# Patient Record
Sex: Male | Born: 1960 | Race: Black or African American | Hispanic: No | Marital: Single | Smoking: Former smoker
Health system: Southern US, Community
[De-identification: ages and names within clinical notes are randomized; demographics above are authoritative.]

## PROBLEM LIST (undated history)

## (undated) DIAGNOSIS — H469 Unspecified optic neuritis: Secondary | ICD-10-CM

## (undated) DIAGNOSIS — H5440 Blindness, one eye, unspecified eye: Secondary | ICD-10-CM

## (undated) DIAGNOSIS — E119 Type 2 diabetes mellitus without complications: Secondary | ICD-10-CM

## (undated) DIAGNOSIS — M1990 Unspecified osteoarthritis, unspecified site: Secondary | ICD-10-CM

## (undated) DIAGNOSIS — I10 Essential (primary) hypertension: Secondary | ICD-10-CM

## (undated) HISTORY — PX: ELBOW SURGERY: SHX618

## (undated) HISTORY — PX: CARPAL TUNNEL RELEASE: SHX101

## (undated) HISTORY — PX: APPENDECTOMY: SHX54

## (undated) HISTORY — PX: EYE SURGERY: SHX253

## (undated) HISTORY — PX: COLONOSCOPY: SHX174

## (undated) HISTORY — PX: NECK SURGERY: SHX720

## (undated) HISTORY — PX: OTHER SURGICAL HISTORY: SHX169

## (undated) HISTORY — PX: WRIST SURGERY: SHX841

---

## 1999-02-06 ENCOUNTER — Ambulatory Visit (HOSPITAL_BASED_OUTPATIENT_CLINIC_OR_DEPARTMENT_OTHER): Admission: RE | Admit: 1999-02-06 | Discharge: 1999-02-06 | Payer: Self-pay | Admitting: Orthopaedic Surgery

## 1999-07-23 HISTORY — PX: FOOT SURGERY: SHX648

## 1999-08-23 ENCOUNTER — Ambulatory Visit (HOSPITAL_BASED_OUTPATIENT_CLINIC_OR_DEPARTMENT_OTHER): Admission: RE | Admit: 1999-08-23 | Discharge: 1999-08-23 | Payer: Self-pay | Admitting: Orthopaedic Surgery

## 2000-03-04 ENCOUNTER — Ambulatory Visit (HOSPITAL_BASED_OUTPATIENT_CLINIC_OR_DEPARTMENT_OTHER): Admission: RE | Admit: 2000-03-04 | Discharge: 2000-03-04 | Payer: Self-pay | Admitting: Orthopaedic Surgery

## 2008-03-25 ENCOUNTER — Ambulatory Visit (HOSPITAL_COMMUNITY): Admission: RE | Admit: 2008-03-25 | Discharge: 2008-03-25 | Payer: Self-pay | Admitting: Orthopedic Surgery

## 2008-05-04 ENCOUNTER — Inpatient Hospital Stay (HOSPITAL_COMMUNITY): Admission: RE | Admit: 2008-05-04 | Discharge: 2008-05-05 | Payer: Self-pay | Admitting: Orthopedic Surgery

## 2009-05-01 ENCOUNTER — Encounter: Admission: RE | Admit: 2009-05-01 | Discharge: 2009-05-01 | Payer: Self-pay | Admitting: Orthopedic Surgery

## 2010-12-04 NOTE — Op Note (Signed)
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   POSTOPERATIVE DIAGNOSIS:  Cervical disk herniation at C6-C7 with right  C7 radiculopathy.   OPERATIVE PROCEDURE:  Anterior cervical diskectomy, fusion, and  instrumentation using a size 8 lordotic precut fibular allograft spacer  with a 40-mm contoured anterior Synthes vector plate with 16-XW variable  angle screws placed into the body of C6, 40-mm screws into the body of  C7.   COMPLICATIONS:  None.   CONDITION:  Stable.   INTRAOPERATIVE FINDINGS:  Disk fragment removed from the posterior  lateral right corner and trimmed down the posterior osteophytes,  satisfactory diskectomy, and indirect foraminotomy.   HISTORY:  Brett Wallace is a very pleasant 49 year old gentleman who injured  himself while at work.  He was moving a heavy object and started  noticing significant neck and progressive right arm pain.  His symptoms  progressed and despite physical therapy and appropriate conservative  management, he failed to get improvement.  Because of the persisting arm  pain and neurological signs, he elected to proceed with surgery.  All  appropriate risks, benefits, and alternatives to surgery were discussed  and consent was obtained.   OPERATIVE NOTE:  The patient was brought to the operating room and  placed supine on the operating table.  After successful induction of  general anesthesia and endotracheal intubation, the rolled towels were  placed between the shoulder blades.  The arms were taped.  The anterior  cervical spine was prepped and draped in a standard  fashion.  Because of  his body habitus, it was very difficult to get __________.   I was able to palpate the thyroid cartilage and the cricoid cartilage  and I made a longitudinal incision centered at the cricoid cartilage.  Sharp dissection was carried out down to and through the platysma.  I  then sharply dissected the deep cervical fascia and then bluntly  dissected until I was able to palpate the anterior cervical spine.  I  could also palpate the carotid sheath laterally and I protected it with  a finger.  I then swept the trachea and esophagus medially and retracted  it with appendiceal retractor.  At this point, I had exposure to the  anterior cervical spine.  I then mobilized the prevertebral fascia to  completely expose the anterior longitudinal ligament.  I placed an 18-  gauge spinal needle into the C6-C7 disk space, took an x-ray, and  confirmed that I was at the appropriate level.  Once confirmed, I then  resected the anterior longitudinal ligament from the midbody of C6 to  the midbody of C7.  I then mobilized the longus coli muscles out  laterally to  the level of the uncovertebral joints.   A self-retaining Caspar retractor was placed with 55-mm blade.  I placed  it underneath the longus colli muscles and then the endotracheal cuff  was deflated.  I expanded the retractor and then reinflated the  endotracheal cuff.  I then placed distraction pins into the body of C6-  C7 and distracted the C6-C7 disk space.   At this point, I had excellent exposure.  I then incised the annulus and  using a combination of pituitary rongeurs, curettes, and Kerrison  rongeurs, I resected the entire disk material.  When I was at the  posterolateral margin, there was a free fragment of disk, which was  easily removed with a pituitary rongeur.  At this point, I then used a  fine curette to develop a plane underneath the annulus, and I resected  the entire annulus and remaining disk material  with a combination of 1  and 2 mm curette.  I was able to trim down the posterior osteophytes.  I  then developed a plane underneath the posterior longitudinal ligament  and resected posterior longitudinal ligament to expose the underlying  thecal sac.  At this point, I was able to clearly indicate that there  was no free fragments underneath the posterior longitudinal ligament.  With the diskectomy completed, I then rasped the endplates so I had  bleeding subchondral bone and measured the interbody space and obtained  an 8-mm precut lordotic fibular graft.  I packed this with Actifuse and  malleted it to the appropriate depth.  I then placed a 14-mm Vector  plate, contoured it for the kyphosis of the area and secured it with 16  and 14-mm variable angle self-drilling screws.  I had excellent  fixation.  I removed all remaining retractors and ensured that the  trachea and esophagus were not entrapped underneath the plate.  I then  irrigated it copiously with normal saline, obtained hemostasis using  bipolar electrocautery, and maintained it with FloSeal, returned the  trachea and esophagus to midline, closed the platysma with interrupted 2-  0 Vicryl sutures and the skin was closed with 3-0 Monocryl.  Steri-  Strips, dry dressing, and collar were applied.  The patient was  transferred to the PACU without incident.  At the end of the case, all  needle, instrument, and sponge counts were correct.      Alvy Beal, MD  Electronically Signed     DDB/MEDQ  D:  05/04/2008  T:  05/05/2008  Job:  [id number]

## 2010-12-07 NOTE — Op Note (Signed)
[place]  Patient:    Brett Wallace, Brett Wallace                         MRN: [id number] Proc. Date: 03/04/00 Adm. Date:  [id number] Attending:  Marcene Corning                           Operative Report  PREOPERATIVE DIAGNOSIS:  Right foot painful hardware.  POSTOPERATIVE DIAGNOSIS: Right foot painful hardware.  PROCEDURE:  Removal of painful hardware right foot.  ANESTHESIA:  General.  SURGEON: Lubertha Basque. Jerl Santos, M.D.  ASSISTANT: Prince Rome, PA  INDICATIONS FOR PROCEDURE: The patient is a 50 year old man who is many months out from a mid foot fusion done with two screws.  This has gone on to fuse well and he is experiencing decreased pain.  Unfortunately there is some pain over the prominent hardware on the dorsal aspect of the foot.  He is offered hardware removal at this point.  Informed operative consent was obtained after discussing the possible complications of reaction to anesthesia and infection.  DESCRIPTION OF PROCEDURE:  The patient was taken to the operating suite where the general anesthetic was applied without difficulty.  He was positioned supine and prepped and draped in a normal sterile fashion.  After the insertion of preoperative IV antibiotic two small stab wounds were made with removal of the two prominent 4.5 mm large fragment screws.  I used fluoroscopy throughout this portion of the case to make appropriate intraoperative decisions and read all these views myself.  The wounds were irrigated followed by reapproximation of skin with simple sutures of nylon.  Adaptic was placed over the wound followed by dry gauze and loose Ace wrap.  Estimated blood loss and intraoperative fluids obtained from the anesthesia records.  No tourniquet was used.  DISPOSITION:  The patient was extubated in the operating room and taken to the recovery room in stable condition.  Plans were for him to go home the same day and follow up in the  office in less than a week.  I will contact him by phone tonight. DD:  03/04/00 TD:  03/04/00 Job: [id number]

## 2010-12-07 NOTE — Op Note (Signed)
[place]  Patient:    Brett Wallace                          MRN: [id number] Proc. Date: 08/23/99 Adm. Date:  [id number] Attending:  Marcene Corning                           Operative Report  PREOPERATIVE DIAGNOSIS:  Right midfoot degeneration.  POSTOPERATIVE DIAGNOSIS:  Right midfoot degeneration.  OPERATION PERFORMED: 1. Right midfoot fusion. 2. Right iliac crest bone graft harvest.  ANESTHESIA:  General.  ATTENDING SURGEON:  Lubertha Basque. Jerl Santos, M.D.  ASSISTANT:  Lindwood Qua, P.A.  INDICATIONS FOR PROCEDURE:  The patient is a 50 year old man with a long history of right foot pain.  He is status post a successful hindfoot fusion many years ago and status post an unsuccessful midfoot about eight months ago.  He has persisted with pain in his midfoot and x-rays and CT showed that his attempted midfoot fusion ad not healed.  Planned procedure at this point is for repeat more aggressive attempt at fusion of his midfoot including several other joints.  We were also going to  harvest iliac crest bone graft to maximize her chance of this healing.  The procedure was discussed with the patient and informed operative consent was obtained after discussion of possible complications of reaction to anesthesia, infection, neurovascular injury and obviously, nounion.  DESCRIPTION OF PROCEDURE:  The patient was taken to an operating suite where general anesthetic was applied with LMA without difficulty.  He was then positioned supine with a bump under the right hip and prepped and draped in normal sterile  fashion.  After administration of preop IV antibiotics, a small incision was made over the iliac crest from the ASIF posterior.  Dissection was carried down to the crest.  The fascia was incised to expose the bone on the superior aspect of the  iliac crest.  An osteotome was used to create a window of bone which was removed. A curet was  used to remove cancellous bone graft.  Gelfoam was then placed after thorough irrigation of the space.  The cortical window was replaced followed by  repair of the fascia with #1 Vicryl in interrupted fashion.  Adipose tissue was  repaired in a couple of layers with absorbable suture followed by skin closure ith staples.  Adaptic was placed on this wound followed by dry gauze and tape.  The  right leg was then elevated, exsanguinated and a tourniquet inflated about the calf.  A dorsal incision was made from the lateral aspect of the navicular distal into the first webspace area.  Care was taken to keep this 4 cm from an old medial incision.  Dissection was carried down to the EHL tendon.  Care was taken to preserve several branches of the superficial peroneal nerve which were seen and the neurovascular bundle.  Subperiosteal dissection was carried out in either direction starting directly below the EHL tendon which was retracted out of harms way. Under fluoroscopic guidance, the unfused joints were localized.  These were primarily between the cuneiforms and the more proximal bones and also between the cuneiforms themselves.  A lamina spreader was used to create some space and to distract unfused joints.  The rongeur was used to remove the cartilage and scar  tissue.  A rectangular window was made on the top of the fusion area  across several joints.  The areas were then packed with the autologous bone graft.  The construct was then stabilized with two 4.5 mm crossed partially threaded cancellous screws. These were placed under fluoroscopic guidance.  Once all this was completed, the unfused joints disappeared on the fluoroscopy views.  The tourniquet was deflated and the foot became pink and warm immediately.  The wound was irrigated followed by closure of subcutaneous tissues with 2-0 undyed Vicryl and skin with staples. Adaptic was placed on the wound followed by dry gauze  and a posterior splint of  plaster.  Estimated blood loss and intraoperative fluids can be obtained from Anesthesia records as can accurate tourniquet time which was approximately one hour.  DISPOSITION:  The patient was taken to the recovery room in stable condition. Plans were for him to stay overnight with discharge home in the morning primarily for pain control. DD:  08/23/99 TD:  08/23/99 Job: [id number]

## 2011-04-23 LAB — CBC
Hemoglobin: 13.5
MCHC: 32.6
RDW: 13.4

## 2011-07-23 ENCOUNTER — Encounter: Payer: Self-pay | Admitting: Emergency Medicine

## 2011-07-23 ENCOUNTER — Emergency Department (INDEPENDENT_AMBULATORY_CARE_PROVIDER_SITE_OTHER): Payer: Self-pay

## 2011-07-23 ENCOUNTER — Emergency Department (HOSPITAL_BASED_OUTPATIENT_CLINIC_OR_DEPARTMENT_OTHER)
Admission: EM | Admit: 2011-07-23 | Discharge: 2011-07-23 | Disposition: A | Discharge status: Home / Self Care | Payer: Self-pay | Attending: Emergency Medicine | Admitting: Emergency Medicine

## 2011-07-23 DIAGNOSIS — F17200 Nicotine dependence, unspecified, uncomplicated: Secondary | ICD-10-CM | POA: Insufficient documentation

## 2011-07-23 DIAGNOSIS — R142 Eructation: Secondary | ICD-10-CM

## 2011-07-23 DIAGNOSIS — K5730 Diverticulosis of large intestine without perforation or abscess without bleeding: Secondary | ICD-10-CM | POA: Insufficient documentation

## 2011-07-23 DIAGNOSIS — K5732 Diverticulitis of large intestine without perforation or abscess without bleeding: Secondary | ICD-10-CM | POA: Insufficient documentation

## 2011-07-23 DIAGNOSIS — E7889 Other lipoprotein metabolism disorders: Secondary | ICD-10-CM

## 2011-07-23 DIAGNOSIS — I10 Essential (primary) hypertension: Secondary | ICD-10-CM | POA: Insufficient documentation

## 2011-07-23 DIAGNOSIS — R109 Unspecified abdominal pain: Secondary | ICD-10-CM | POA: Insufficient documentation

## 2011-07-23 DIAGNOSIS — K5792 Diverticulitis of intestine, part unspecified, without perforation or abscess without bleeding: Secondary | ICD-10-CM

## 2011-07-23 HISTORY — DX: Unspecified optic neuritis: H46.9

## 2011-07-23 HISTORY — DX: Blindness, one eye, unspecified eye: H54.40

## 2011-07-23 HISTORY — DX: Essential (primary) hypertension: I10

## 2011-07-23 LAB — CBC
MCH: 29 pg (ref 26.0–34.0)
MCHC: 33.3 g/dL (ref 30.0–36.0)
Platelets: 214 10*3/uL (ref 150–400)
RBC: 4.65 MIL/uL (ref 4.22–5.81)
RDW: 12.4 % (ref 11.5–15.5)
WBC: 6.4 10*3/uL (ref 4.0–10.5)

## 2011-07-23 LAB — COMPREHENSIVE METABOLIC PANEL
ALT: 11 U/L (ref 0–53)
Alkaline Phosphatase: 53 U/L (ref 39–117)
BUN: 8 mg/dL (ref 6–23)
CO2: 24 mEq/L (ref 19–32)
Glucose, Bld: 138 mg/dL — ABNORMAL HIGH (ref 70–99)
Sodium: 139 mEq/L (ref 135–145)
Total Bilirubin: 0.4 mg/dL (ref 0.3–1.2)
Total Protein: 7.7 g/dL (ref 6.0–8.3)

## 2011-07-23 LAB — URINE MICROSCOPIC-ADD ON

## 2011-07-23 LAB — LIPASE, BLOOD: Lipase: 24 U/L (ref 11–59)

## 2011-07-23 LAB — DIFFERENTIAL
Lymphs Abs: 1.6 10*3/uL (ref 0.7–4.0)
Neutro Abs: 4 10*3/uL (ref 1.7–7.7)

## 2011-07-23 LAB — URINALYSIS, ROUTINE W REFLEX MICROSCOPIC: Ketones, ur: NEGATIVE mg/dL

## 2011-07-23 MED ORDER — CIPROFLOXACIN HCL 500 MG PO TABS: 500.0000 mg | ORAL_TABLET | Freq: Two times a day (BID) | ORAL | Status: AC

## 2011-07-23 MED ORDER — HYDROCODONE-ACETAMINOPHEN 5-325 MG PO TABS: 1.0000 | ORAL_TABLET | Freq: Four times a day (QID) | ORAL | Status: AC | PRN

## 2011-07-23 MED ORDER — METRONIDAZOLE IN NACL 5-0.79 MG/ML-% IV SOLN: 500.0000 mg | Freq: Once | INTRAVENOUS | Status: DC

## 2011-07-23 MED ORDER — METRONIDAZOLE 500 MG PO TABS: 500.0000 mg | ORAL_TABLET | Freq: Three times a day (TID) | ORAL | Status: AC

## 2011-07-23 MED ORDER — SODIUM CHLORIDE 0.9 % IV SOLN
INTRAVENOUS | Status: DC
  Administered 2011-07-23: 05:00:00 via INTRAVENOUS

## 2011-07-23 MED ORDER — CIPROFLOXACIN HCL 500 MG PO TABS
500.0000 mg | ORAL_TABLET | Freq: Once | ORAL | Status: AC
  Administered 2011-07-23: 500 mg via ORAL
  Filled 2011-07-23: qty 1

## 2011-07-23 MED ORDER — IOHEXOL 300 MG/ML  SOLN
100.0000 mL | Freq: Once | INTRAMUSCULAR | Status: AC | PRN
  Administered 2011-07-23: 100 mL via INTRAVENOUS

## 2011-07-23 MED ORDER — IOHEXOL 300 MG/ML  SOLN: 20.0000 mL | INTRAMUSCULAR | Status: AC

## 2011-07-23 MED ORDER — METRONIDAZOLE 500 MG PO TABS
500.0000 mg | ORAL_TABLET | Freq: Once | ORAL | Status: AC
  Administered 2011-07-23: 500 mg via ORAL
  Filled 2011-07-23: qty 1

## 2011-07-23 NOTE — ED Provider Notes (Signed)
History     CSN: [id number]  Arrival date & time 07/23/11  9562   First MD Initiated Contact with Patient 07/23/11 [id number]      Chief Complaint  Patient presents with  . Abdominal Pain    abd pain with bloating Hx of inguinal Hernia    (Consider location/radiation/quality/duration/timing/severity/associated sxs/prior treatment) HPI Is a 51 year old black male with a four-day history of abdominal pain. The pain seems to originate in the epigastrium but moves around to the abdomen diffusely. He also feels that his abdomen is distended and tense. The pain is moderate to severe. It is worse with palpation or movement. He has had no nausea, vomiting, diarrhea or constipation. He has had no fever or chills. He has had no chest pain or shortness of breath. The pain has not changed with eating nor has his appetite changed. He states the pain became worse this morning which is why he is here. He has a history of bilateral inguinal hernias but states that they have not acutely changed.  Past Medical History  Diagnosis Date  . Hypertension   . Optic neuritis, left   . Blind left eye     Past Surgical History  Procedure Date  . Appendectomy   . Eye surgery   . Neck sugery     History reviewed. No pertinent family history.  History  Substance Use Topics  . Smoking status: Current Everyday Smoker  . Smokeless tobacco: Not on file  . Alcohol Use: 2.4 oz/week    4 Cans of beer per week      Review of Systems  All other systems reviewed and are negative.    Allergies  Penicillins  Home Medications   Current Outpatient Rx  Name Route Sig Dispense Refill  . CYCLOBENZAPRINE HCL 10 MG PO TABS Oral Take 10 mg by mouth 3 (three) times daily as needed.      Marland Kitchen LISINOPRIL 10 MG PO TABS Oral Take 10 mg by mouth daily.        BP 155/88  Pulse 100  Temp 98.5 F (36.9 C)  Resp 20  SpO2 97%  Physical Exam General: Well-developed, well-nourished male in no acute distress; appearance  consistent with age of record HENT: normocephalic, atraumatic Eyes: Left pupil round and fixed; right pupil round reactive; mild right lateral strabismus, extraocular muscles otherwise intact; arcus senilis bilaterally Neck: supple Heart: regular rate and rhythm Lungs: clear to auscultation bilaterally Abdomen:  Soft; distended; diffusely tender; bowel sounds present; bilateral nontender inguinal hernias  Extremities: No deformity; full range of motion Neurologic: Awake, alert and oriented; motor function intact in all extremities and symmetric; no facial droop Skin: Warm and dry Psychiatric: Normal mood and affect    ED Course  Procedures (including critical care time)    MDM   Nursing notes and vitals signs, including pulse oximetry, reviewed.  Summary of this visit's results, reviewed by myself:  Labs:  Results for orders placed during the hospital encounter of 07/23/11  CBC      Component Value Range   WBC 6.4  4.0 - 10.5 (K/uL)   RBC 4.65  4.22 - 5.81 (MIL/uL)   Hemoglobin 13.5  13.0 - 17.0 (g/dL)   HCT 65.7  84.6 - 96.2 (%)   MCV 87.3  78.0 - 100.0 (fL)   MCH 29.0  26.0 - 34.0 (pg)   MCHC 33.3  30.0 - 36.0 (g/dL)   RDW 95.2  84.1 - 32.4 (%)   Platelets 214  150 - 400 (K/uL)  DIFFERENTIAL      Component Value Range   Neutrophils Relative 63  43 - 77 (%)   Neutro Abs 4.0  1.7 - 7.7 (K/uL)   Lymphocytes Relative 26  12 - 46 (%)   Lymphs Abs 1.6  0.7 - 4.0 (K/uL)   Monocytes Relative 10  3 - 12 (%)   Monocytes Absolute 0.6  0.1 - 1.0 (K/uL)   Eosinophils Relative 2  0 - 5 (%)   Eosinophils Absolute 0.1  0.0 - 0.7 (K/uL)   Basophils Relative 1  0 - 1 (%)   Basophils Absolute 0.0  0.0 - 0.1 (K/uL)  COMPREHENSIVE METABOLIC PANEL      Component Value Range   Sodium 139  135 - 145 (mEq/L)   Potassium 3.6  3.5 - 5.1 (mEq/L)   Chloride 102  96 - 112 (mEq/L)   CO2 24  19 - 32 (mEq/L)   Glucose, Bld 138 (*) 70 - 99 (mg/dL)   BUN 8  6 - 23 (mg/dL)   Creatinine, Ser  6.04  0.50 - 1.35 (mg/dL)   Calcium 9.1  8.4 - 54.0 (mg/dL)   Total Protein 7.7  6.0 - 8.3 (g/dL)   Albumin 3.9  3.5 - 5.2 (g/dL)   AST 23  0 - 37 (U/L)   ALT 11  0 - 53 (U/L)   Alkaline Phosphatase 53  39 - 117 (U/L)   Total Bilirubin 0.4  0.3 - 1.2 (mg/dL)   GFR calc non Af Amer 78 (*) >90 (mL/min)   GFR calc Af Amer >90  >90 (mL/min)  LIPASE, BLOOD      Component Value Range   Lipase 24  11 - 59 (U/L)  URINALYSIS, ROUTINE W REFLEX MICROSCOPIC      Component Value Range   Color, Urine YELLOW  YELLOW    APPearance CLEAR  CLEAR    Specific Gravity, Urine 1.016  1.005 - 1.030    pH 5.5  5.0 - 8.0    Glucose, UA NEGATIVE  NEGATIVE (mg/dL)   Hgb urine dipstick TRACE (*) NEGATIVE    Bilirubin Urine NEGATIVE  NEGATIVE    Ketones, ur NEGATIVE  NEGATIVE (mg/dL)   Protein, ur NEGATIVE  NEGATIVE (mg/dL)   Urobilinogen, UA 0.2  0.0 - 1.0 (mg/dL)   Nitrite NEGATIVE  NEGATIVE    Leukocytes, UA TRACE (*) NEGATIVE   URINE MICROSCOPIC-ADD ON      Component Value Range   Squamous Epithelial / LPF FEW (*) RARE    WBC, UA 3-6  <3 (WBC/hpf)   RBC / HPF 0-2  <3 (RBC/hpf)   Bacteria, UA MANY (*) RARE    Ct Abdomen Pelvis W Contrast  07/23/2011  *RADIOLOGY REPORT*  Clinical Data: Abdominal pain and distension for 5-10 days.  The  CT ABDOMEN AND PELVIS WITH CONTRAST  Technique:  Multidetector CT imaging of the abdomen and pelvis was performed following the standard protocol during bolus administration of intravenous contrast.  Contrast: OMNIPAQUE IOHEXOL 300 MG/ML IV SOLN  Comparison: None.  Findings: Slight interstitial fibrosis or dependent atelectasis in the lung bases.  Calcified granulomas in the liver.  The liver, spleen, gallbladder, and pancreas are otherwise unremarkable. Adrenal glands, kidneys, abdominal aorta, and mesenteric lymph nodes are unremarkable.  The stomach and small bowel are not distended.  Stool filled colon without distension or wall thickening.  Minimal periumbilical  hernia containing fat.  No free fluid or free air in the  abdomen.  Mildly prominent visceral adipose tissues.  Retroperitoneal lymph nodes are present in the periaortic and pericaval regions, measuring up to about 10 mm diameter, representing upper limits of normal size.  These may represent reactive lymph nodes.  Pelvis:  Diverticulosis of the sigmoid colon with inflammatory stranding in the fat around the mid sigmoid region consistent with diverticulitis.  No focal abscess.  Small fat containing bilateral inguinal hernias.  Bladder wall is not thickened.  No free fluid or loculated fluid collection in the abdomen.  The appendix is surgically absent.  Mild degenerative changes in the spine with normal alignment.  IMPRESSION: Diverticulosis of the sigmoid colon with.  Sigmoid fatty infiltration consistent with diverticulitis.  No focal abscess.  No free air or free fluid.  Probable reactive retroperitoneal lymph nodes.  Original Report Authenticated By: Marlon Pel, M.D.            Hanley Seamen, MD 07/23/11 [phone]

## 2011-07-23 NOTE — ED Notes (Signed)
Pt reports abd distention x 3-5 days

## 2013-11-02 ENCOUNTER — Encounter (HOSPITAL_BASED_OUTPATIENT_CLINIC_OR_DEPARTMENT_OTHER): Payer: Self-pay | Admitting: Emergency Medicine

## 2013-11-02 ENCOUNTER — Emergency Department (HOSPITAL_BASED_OUTPATIENT_CLINIC_OR_DEPARTMENT_OTHER)
Admission: EM | Admit: 2013-11-02 | Discharge: 2013-11-02 | Discharge status: Against Medical Advice | Payer: Self-pay | Attending: Emergency Medicine | Admitting: Emergency Medicine

## 2013-11-02 DIAGNOSIS — E119 Type 2 diabetes mellitus without complications: Secondary | ICD-10-CM | POA: Insufficient documentation

## 2013-11-02 DIAGNOSIS — M549 Dorsalgia, unspecified: Secondary | ICD-10-CM | POA: Insufficient documentation

## 2013-11-02 DIAGNOSIS — M542 Cervicalgia: Secondary | ICD-10-CM | POA: Insufficient documentation

## 2013-11-02 DIAGNOSIS — F17200 Nicotine dependence, unspecified, uncomplicated: Secondary | ICD-10-CM | POA: Insufficient documentation

## 2013-11-02 DIAGNOSIS — I10 Essential (primary) hypertension: Secondary | ICD-10-CM | POA: Insufficient documentation

## 2013-11-02 DIAGNOSIS — M79609 Pain in unspecified limb: Secondary | ICD-10-CM | POA: Insufficient documentation

## 2013-11-02 HISTORY — DX: Type 2 diabetes mellitus without complications: E11.9

## 2013-11-02 NOTE — ED Notes (Signed)
Pt not in room, gown lying on bed,.

## 2013-11-02 NOTE — ED Notes (Signed)
Hx djd  Onset x 1 week has plate in neck  Now has pain in neck back and both arms

## 2014-02-28 ENCOUNTER — Encounter (HOSPITAL_BASED_OUTPATIENT_CLINIC_OR_DEPARTMENT_OTHER): Payer: Self-pay | Admitting: Emergency Medicine

## 2014-02-28 ENCOUNTER — Emergency Department (HOSPITAL_BASED_OUTPATIENT_CLINIC_OR_DEPARTMENT_OTHER)
Admission: EM | Admit: 2014-02-28 | Discharge: 2014-02-28 | Disposition: A | Discharge status: Home / Self Care | Payer: Self-pay | Attending: Emergency Medicine | Admitting: Emergency Medicine

## 2014-02-28 DIAGNOSIS — M545 Low back pain, unspecified: Secondary | ICD-10-CM | POA: Insufficient documentation

## 2014-02-28 DIAGNOSIS — E119 Type 2 diabetes mellitus without complications: Secondary | ICD-10-CM | POA: Insufficient documentation

## 2014-02-28 DIAGNOSIS — I10 Essential (primary) hypertension: Secondary | ICD-10-CM | POA: Insufficient documentation

## 2014-02-28 DIAGNOSIS — M542 Cervicalgia: Secondary | ICD-10-CM | POA: Insufficient documentation

## 2014-02-28 DIAGNOSIS — H5440 Blindness, one eye, unspecified eye: Secondary | ICD-10-CM | POA: Insufficient documentation

## 2014-02-28 DIAGNOSIS — F17200 Nicotine dependence, unspecified, uncomplicated: Secondary | ICD-10-CM | POA: Insufficient documentation

## 2014-02-28 DIAGNOSIS — K648 Other hemorrhoids: Secondary | ICD-10-CM | POA: Insufficient documentation

## 2014-02-28 DIAGNOSIS — Z880 Allergy status to penicillin: Secondary | ICD-10-CM | POA: Insufficient documentation

## 2014-02-28 DIAGNOSIS — K649 Unspecified hemorrhoids: Secondary | ICD-10-CM

## 2014-02-28 DIAGNOSIS — K625 Hemorrhage of anus and rectum: Secondary | ICD-10-CM | POA: Insufficient documentation

## 2014-02-28 DIAGNOSIS — Z79899 Other long term (current) drug therapy: Secondary | ICD-10-CM | POA: Insufficient documentation

## 2014-02-28 LAB — CBC WITH DIFFERENTIAL/PLATELET
Basophils Absolute: 0 10*3/uL (ref 0.0–0.1)
Basophils Relative: 1 % (ref 0–1)
Eosinophils Absolute: 0.2 10*3/uL (ref 0.0–0.7)
Eosinophils Relative: 3 % (ref 0–5)
HCT: 38.8 % — ABNORMAL LOW (ref 39.0–52.0)
Hemoglobin: 13.3 g/dL (ref 13.0–17.0)
LYMPHS PCT: 29 % (ref 12–46)
Lymphs Abs: 1.7 10*3/uL (ref 0.7–4.0)
MCH: 31.6 pg (ref 26.0–34.0)
MCHC: 34.3 g/dL (ref 30.0–36.0)
MCV: 92.2 fL (ref 78.0–100.0)
Monocytes Absolute: 0.5 10*3/uL (ref 0.1–1.0)
Monocytes Relative: 9 % (ref 3–12)
Neutro Abs: 3.5 10*3/uL (ref 1.7–7.7)
Neutrophils Relative %: 58 % (ref 43–77)
PLATELETS: 203 10*3/uL (ref 150–400)
RBC: 4.21 MIL/uL — AB (ref 4.22–5.81)
RDW: 11.3 % — AB (ref 11.5–15.5)
WBC: 5.9 10*3/uL (ref 4.0–10.5)

## 2014-02-28 LAB — COMPREHENSIVE METABOLIC PANEL
ALBUMIN: 3.9 g/dL (ref 3.5–5.2)
ALT: 30 U/L (ref 0–53)
ANION GAP: 16 — AB (ref 5–15)
AST: 30 U/L (ref 0–37)
Alkaline Phosphatase: 53 U/L (ref 39–117)
BUN: 18 mg/dL (ref 6–23)
CO2: 23 mEq/L (ref 19–32)
CREATININE: 1.7 mg/dL — AB (ref 0.50–1.35)
Calcium: 9.6 mg/dL (ref 8.4–10.5)
Chloride: 96 mEq/L (ref 96–112)
GFR calc non Af Amer: 45 mL/min — ABNORMAL LOW (ref >90)
GFR, EST AFRICAN AMERICAN: 52 mL/min — AB (ref >90)
Glucose, Bld: 164 mg/dL — ABNORMAL HIGH (ref 70–99)
Potassium: 4.1 mEq/L (ref 3.7–5.3)
Sodium: 135 mEq/L — ABNORMAL LOW (ref 137–147)
TOTAL PROTEIN: 7.6 g/dL (ref 6.0–8.3)
Total Bilirubin: 0.3 mg/dL (ref 0.3–1.2)

## 2014-02-28 LAB — OCCULT BLOOD X 1 CARD TO LAB, STOOL: Fecal Occult Bld: POSITIVE — AB

## 2014-02-28 MED ORDER — IBUPROFEN 200 MG PO TABS: 600.0000 mg | ORAL_TABLET | Freq: Three times a day (TID) | ORAL | Status: DC

## 2014-02-28 MED ORDER — DOCUSATE SODIUM 100 MG PO CAPS: 100.0000 mg | ORAL_CAPSULE | Freq: Two times a day (BID) | ORAL | Status: DC

## 2014-02-28 NOTE — ED Provider Notes (Signed)
CSN: [id number]     Arrival date & time 02/28/14  1501 History  This chart was scribed for Candyce Churn III, * by Modena Jansky, ED Scribe. This patient was seen in room MH02/MH02 and the patient's care was started at 5:15 PM.    Chief Complaint  Patient presents with  . Rectal Bleeding   Patient is a 53 y.o. male presenting with hematochezia. The history is provided by the patient. No language interpreter was used.  Rectal Bleeding Quality:  Bright red Duration:  1 day Timing:  Constant Progression:  Unchanged Chronicity:  New Context: spontaneously   Context: not rectal pain   Similar prior episodes: no   Relieved by:  None tried Worsened by:  Nothing tried Ineffective treatments:  None tried Associated symptoms: abdominal pain   Associated symptoms: no fever    HPI Comments: Brett Wallace is a  53 y.o. male with a hx diverticulitis who presents to the Emergency Department complaining of rectal bleeding that has been going on for a day. He states that he notices bright red blood on toilet paper after his BMs. He states that he has a cramping abdominal pain. He reports no rectal pain or constipation. He states that his diet is normal except that he does not eat enough fiber at times. He denies any fever.   Pt also complains of constant moderate pain in his back, neck, and lower extremities. He states that he is on medication for his pain.   Past Medical History  Diagnosis Date  . Hypertension   . Optic neuritis, left   . Blind left eye   . Diabetes mellitus without complication    Past Surgical History  Procedure Laterality Date  . Appendectomy    . Eye surgery    . Neck sugery     History reviewed. No pertinent family history. History  Substance Use Topics  . Smoking status: Current Every Day Smoker  . Smokeless tobacco: Not on file  . Alcohol Use: 2.4 oz/week    4 Cans of beer per week    Review of Systems  Constitutional: Negative for fever.   Gastrointestinal: Positive for abdominal pain, hematochezia and anal bleeding. Negative for constipation.  Musculoskeletal: Positive for back pain, myalgias and neck pain.  All other systems reviewed and are negative.  Allergies  Penicillins  Home Medications   Prior to Admission medications   Medication Sig Start Date End Date Taking? Authorizing Provider  allopurinol (ZYLOPRIM) 100 MG tablet Take 100 mg by mouth daily.   Yes Historical Provider, MD  hydrochlorothiazide (HYDRODIURIL) 25 MG tablet Take 25 mg by mouth daily.   Yes Historical Provider, MD  pravastatin (PRAVACHOL) 10 MG tablet Take 10 mg by mouth daily.   Yes Historical Provider, MD  carvedilol (COREG) 25 MG tablet Take 25 mg by mouth 2 (two) times daily with a meal.    Historical Provider, MD  lisinopril (PRINIVIL,ZESTRIL) 10 MG tablet Take 10 mg by mouth daily.      Historical Provider, MD  metFORMIN (GLUCOPHAGE) 1000 MG tablet Take 1,000 mg by mouth 2 (two) times daily with a meal.    Historical Provider, MD   BP 130/80  Pulse 76  Temp(Src) 98.3 F (36.8 C) (Oral)  Resp 18  Ht 5\' 10"  (1.778 m)  Wt 277 lb (125.646 kg)  BMI 39.75 kg/m2  SpO2 99% Physical Exam  Nursing note and vitals reviewed. Constitutional: He is oriented to person, place, and time. He appears well-developed  and well-nourished. No distress.  HENT:  Head: Normocephalic and atraumatic.  Mouth/Throat: Oropharynx is clear and moist.  Eyes: Conjunctivae are normal. Pupils are equal, round, and reactive to light. No scleral icterus.  Neck: Neck supple.  Cardiovascular: Normal rate, regular rhythm, normal heart sounds and intact distal pulses.   No murmur heard. Pulmonary/Chest: Effort normal and breath sounds normal. No stridor. No respiratory distress. He has no wheezes. He has no rales.  Abdominal: Soft. He exhibits no distension. There is no tenderness. There is no rigidity, no rebound and no guarding.  Genitourinary: Rectal exam shows internal  hemorrhoid. Rectal exam shows no external hemorrhoid and no tenderness. Guaiac positive stool (minimal stool in rectum, light brown in color.).  Musculoskeletal: Normal range of motion. He exhibits no edema.       Lumbar back: He exhibits tenderness (left sided) and bony tenderness.  Neurological: He is alert and oriented to person, place, and time. He has normal strength.  Reflex Scores:      Patellar reflexes are 2+ on the right side and 2+ on the left side. Normal BLE strength  Skin: Skin is warm and dry. No rash noted.  Psychiatric: He has a normal mood and affect. His behavior is normal.    ED Course  Procedures (including critical care time) DIAGNOSTIC STUDIES: Oxygen Saturation is 99% on RA, normal by my interpretation.    COORDINATION OF CARE: 5:19 PM- Pt advised of plan for treatment which includes labs and pt agrees.  Labs Review Labs Reviewed  CBC WITH DIFFERENTIAL - Abnormal; Notable for the following:    RBC 4.21 (*)    HCT 38.8 (*)    RDW 11.3 (*)    All other components within normal limits  COMPREHENSIVE METABOLIC PANEL - Abnormal; Notable for the following:    Sodium 135 (*)    Glucose, Bld 164 (*)    Creatinine, Ser 1.70 (*)    GFR calc non Af Amer 45 (*)    GFR calc Af Amer 52 (*)    Anion gap 16 (*)    All other components within normal limits  OCCULT BLOOD X 1 CARD TO LAB, STOOL - Abnormal; Notable for the following:    Fecal Occult Bld POSITIVE (*)    All other components within normal limits  POC OCCULT BLOOD, ED    Imaging Review No results found.   EKG Interpretation None      MDM   Final diagnoses:  Hemorrhoids, unspecified hemorrhoid type    53 yo male with bright red blood in stool starting today.  I think it's from hemorrhoids.  Lower suspicion for diverticular bleed.  No evidence of brisk upper GI bleeding.  Hg normal.  Pt reports some known moderate renal dysfunction for which he is evaluated at his PCPs office.    He also  complained of left sided low back pain on ROS.  He has no red flags for worrisome spinal pathology.  Advised NSAIDs and PCP follow up.     I personally performed the services described in this documentation, which was scribed in my presence. The recorded information has been reviewed and is accurate.      Merrie RoofJohn David Jermey Closs III, MD 02/28/14 [phone]

## 2014-02-28 NOTE — Discharge Instructions (Signed)

## 2014-02-28 NOTE — ED Notes (Signed)
Pt c/o rectal bleeding x 1 day described  As bright red blood.

## 2014-07-18 ENCOUNTER — Encounter (HOSPITAL_BASED_OUTPATIENT_CLINIC_OR_DEPARTMENT_OTHER): Payer: Self-pay

## 2014-07-18 ENCOUNTER — Emergency Department (HOSPITAL_BASED_OUTPATIENT_CLINIC_OR_DEPARTMENT_OTHER): Payer: Self-pay

## 2014-07-18 ENCOUNTER — Emergency Department (HOSPITAL_BASED_OUTPATIENT_CLINIC_OR_DEPARTMENT_OTHER)
Admission: EM | Admit: 2014-07-18 | Discharge: 2014-07-18 | Disposition: A | Discharge status: Home / Self Care | Payer: Medicaid Other | Attending: Emergency Medicine | Admitting: Emergency Medicine

## 2014-07-18 DIAGNOSIS — R52 Pain, unspecified: Secondary | ICD-10-CM

## 2014-07-18 DIAGNOSIS — Z9889 Other specified postprocedural states: Secondary | ICD-10-CM | POA: Insufficient documentation

## 2014-07-18 DIAGNOSIS — H5442 Blindness, left eye, normal vision right eye: Secondary | ICD-10-CM | POA: Insufficient documentation

## 2014-07-18 DIAGNOSIS — Z720 Tobacco use: Secondary | ICD-10-CM | POA: Insufficient documentation

## 2014-07-18 DIAGNOSIS — I10 Essential (primary) hypertension: Secondary | ICD-10-CM | POA: Insufficient documentation

## 2014-07-18 DIAGNOSIS — E119 Type 2 diabetes mellitus without complications: Secondary | ICD-10-CM | POA: Insufficient documentation

## 2014-07-18 DIAGNOSIS — Z79899 Other long term (current) drug therapy: Secondary | ICD-10-CM | POA: Insufficient documentation

## 2014-07-18 DIAGNOSIS — Z880 Allergy status to penicillin: Secondary | ICD-10-CM | POA: Insufficient documentation

## 2014-07-18 DIAGNOSIS — M542 Cervicalgia: Secondary | ICD-10-CM

## 2014-07-18 MED ORDER — OXYCODONE-ACETAMINOPHEN 5-325 MG PO TABS: 2.0000 | ORAL_TABLET | ORAL | Status: DC | PRN

## 2014-07-18 NOTE — ED Notes (Signed)
Pt reports neck pain that radiates down his left arm.  Reports he has been told he has a nerve issue in his neck and disc problems but doesn't have insurance to see a neurologist. Pt reports numbness and tingling in left arm.

## 2014-07-18 NOTE — ED Notes (Signed)
MD at bedside. 

## 2014-07-18 NOTE — ED Provider Notes (Signed)
CSN: [id number]     Arrival date & time 07/18/14  1442 History  This chart was scribed for Nelia Shiobert L Ravon Mortellaro, MD by Abel PrestoKara Demonbreun, ED Scribe. This patient was seen in room MH01/MH01 and the patient's care was started at 3:40 PM.    Chief Complaint  Patient presents with  . Neck Pain     The history is provided by the patient. No language interpreter was used.   HPI Comments: Brett Wallace is a 53 y.o. male who presents to the Emergency Department complaining of neck pain. Pt notes he has pain at baseline but pain has been radiating down his left arm the last couple of weeks.  Pt has a plate in his neck put in 2010 at [facility].  He is unsure of the exact location in his neck.   Pt notes he has had an MRI done since the surgery. Pt takes tramadol and Tylenol for relief.  Pt sees a kidney specialist at [facility].   Past Medical History  Diagnosis Date  . Hypertension   . Optic neuritis, left   . Blind left eye   . Diabetes mellitus without complication    Past Surgical History  Procedure Laterality Date  . Appendectomy    . Eye surgery    . Neck sugery     No family history on file. History  Substance Use Topics  . Smoking status: Current Every Day Smoker  . Smokeless tobacco: Not on file  . Alcohol Use: 2.4 oz/week    4 Cans of beer per week    Review of Systems  Musculoskeletal: Positive for neck pain.  Neurological: Negative for numbness.    All other systems reviewed and are negative  Allergies  Penicillins  Home Medications   Prior to Admission medications   Medication Sig Start Date End Date Taking? Authorizing Provider  glipiZIDE (GLUCOTROL) 5 MG tablet Take by mouth daily before breakfast.   Yes Historical Provider, MD  traZODone (DESYREL) 50 MG tablet Take 50 mg by mouth at bedtime.   Yes Historical Provider, MD  allopurinol (ZYLOPRIM) 100 MG tablet Take 100 mg by mouth daily.    Historical Provider, MD  carvedilol (COREG) 25 MG tablet  Take 25 mg by mouth 2 (two) times daily with a meal.    Historical Provider, MD  docusate sodium (COLACE) 100 MG capsule Take 1 capsule (100 mg total) by mouth every 12 (twelve) hours. 02/28/14   Candyce ChurnJohn David Wofford III, MD  hydrochlorothiazide (HYDRODIURIL) 25 MG tablet Take 25 mg by mouth daily.    Historical Provider, MD  ibuprofen (ADVIL,MOTRIN) 200 MG tablet Take 3 tablets (600 mg total) by mouth 3 (three) times daily. 02/28/14   Candyce ChurnJohn David Wofford III, MD  lisinopril (PRINIVIL,ZESTRIL) 10 MG tablet Take 10 mg by mouth daily.      Historical Provider, MD  metFORMIN (GLUCOPHAGE) 1000 MG tablet Take 1,000 mg by mouth 2 (two) times daily with a meal.    Historical Provider, MD  oxyCODONE-acetaminophen (PERCOCET/ROXICET) 5-325 MG per tablet Take 2 tablets by mouth every 4 (four) hours as needed for moderate pain or severe pain. 07/18/14   Nelia Shiobert L Felton Buczynski, MD  pravastatin (PRAVACHOL) 10 MG tablet Take 10 mg by mouth daily.    Historical Provider, MD   BP 138/80 mmHg  Pulse 71  Temp(Src) 98.7 F (37.1 C) (Oral)  Resp 18  SpO2 100% Physical Exam Physical Exam  Nursing note and vitals reviewed. Constitutional: He  is oriented to person, place, and time. He appears well-developed and well-nourished. No distress.  HENT:  Head: Normocephalic and atraumatic.  Eyes: Pupils are equal, round, and reactive to light.  Neck: Decreased range of motion.  Tenderness noted left pericervical area and to the posterior upper back area.   Cardiovascular: Normal rate and intact distal pulses.   Pulmonary/Chest: No respiratory distress.  Abdominal: Normal appearance. He exhibits no distension.  Musculoskeletal: Normal range of motion.  Neurological: He is alert and oriented to person, place, and time. No cranial nerve deficit.  Skin: Skin is warm and dry. No rash noted.  Psychiatric: He has a normal mood and affect. His behavior is normal.   ED Course  Procedures (including critical care time) Labs  Review Labs Reviewed - No data to display  Imaging Review Dg Cervical Spine Complete  07/18/2014   CLINICAL DATA:  The patient was injured at work 5 years ago. Was pushing a boat out of the way at work. The patient has constant pain, worse over the last 3 weeks. Bilateral pain is worse on the left side. No new injury. History of surgery of the neck.  EXAM: CERVICAL SPINE  4+ VIEWS  [facility]  [facility]  FINDINGS: There is no evidence of cervical spine fracture or prevertebral soft tissue swelling. There is loss of cervical lordosis. This may be secondary to splinting, soft tissue injury, or positioning. Degenerative changes are most marked at C3-4, C4-5. At these levels there is bilateral foraminal narrowing. The patient has had previously anterior fusion of C6-7. No acute fracture or subluxation identified.  IMPRESSION: 1. Postop changes. 2. Degenerative changes. 3.  No evidence for acute  abnormality.   Electronically Signed   By: Rosalie GumsBeth  Brown M.D.   On: 07/18/2014 15:23    I personally performed the services described in this documentation, which was scribed in my presence. The recorded information has been reviewed and considered.  Ordered an MR for his neck to be done this Saturday.  MDM   Final diagnoses:  Neck pain        Nelia Shiobert L Nohemi Nicklaus, MD 07/18/14 [phone]

## 2014-07-18 NOTE — Discharge Instructions (Signed)

## 2014-07-19 ENCOUNTER — Ambulatory Visit (HOSPITAL_BASED_OUTPATIENT_CLINIC_OR_DEPARTMENT_OTHER): Payer: Self-pay

## 2014-07-19 ENCOUNTER — Other Ambulatory Visit (HOSPITAL_BASED_OUTPATIENT_CLINIC_OR_DEPARTMENT_OTHER): Payer: Self-pay | Admitting: Emergency Medicine

## 2014-07-19 DIAGNOSIS — R52 Pain, unspecified: Secondary | ICD-10-CM

## 2014-07-23 ENCOUNTER — Emergency Department (HOSPITAL_BASED_OUTPATIENT_CLINIC_OR_DEPARTMENT_OTHER)
Admission: EM | Admit: 2014-07-23 | Discharge: 2014-07-23 | Disposition: A | Discharge status: Home / Self Care | Payer: Medicaid Other | Attending: Emergency Medicine | Admitting: Emergency Medicine

## 2014-07-23 ENCOUNTER — Encounter (HOSPITAL_BASED_OUTPATIENT_CLINIC_OR_DEPARTMENT_OTHER): Payer: Self-pay

## 2014-07-23 ENCOUNTER — Ambulatory Visit (HOSPITAL_BASED_OUTPATIENT_CLINIC_OR_DEPARTMENT_OTHER)
Admission: RE | Admit: 2014-07-23 | Discharge: 2014-07-23 | Disposition: A | Discharge status: Home / Self Care | Payer: Medicaid Other | Source: Ambulatory Visit | Attending: Emergency Medicine | Admitting: Emergency Medicine

## 2014-07-23 ENCOUNTER — Ambulatory Visit (HOSPITAL_BASED_OUTPATIENT_CLINIC_OR_DEPARTMENT_OTHER): Payer: Self-pay

## 2014-07-23 DIAGNOSIS — M4802 Spinal stenosis, cervical region: Secondary | ICD-10-CM | POA: Insufficient documentation

## 2014-07-23 DIAGNOSIS — M2578 Osteophyte, vertebrae: Secondary | ICD-10-CM | POA: Insufficient documentation

## 2014-07-23 DIAGNOSIS — I10 Essential (primary) hypertension: Secondary | ICD-10-CM | POA: Insufficient documentation

## 2014-07-23 DIAGNOSIS — Z981 Arthrodesis status: Secondary | ICD-10-CM | POA: Insufficient documentation

## 2014-07-23 DIAGNOSIS — M5022 Other cervical disc displacement, mid-cervical region: Secondary | ICD-10-CM | POA: Insufficient documentation

## 2014-07-23 DIAGNOSIS — E119 Type 2 diabetes mellitus without complications: Secondary | ICD-10-CM | POA: Insufficient documentation

## 2014-07-23 DIAGNOSIS — H5442 Blindness, left eye, normal vision right eye: Secondary | ICD-10-CM | POA: Insufficient documentation

## 2014-07-23 DIAGNOSIS — M542 Cervicalgia: Secondary | ICD-10-CM

## 2014-07-23 DIAGNOSIS — Z880 Allergy status to penicillin: Secondary | ICD-10-CM | POA: Insufficient documentation

## 2014-07-23 DIAGNOSIS — Z720 Tobacco use: Secondary | ICD-10-CM | POA: Insufficient documentation

## 2014-07-23 DIAGNOSIS — M5412 Radiculopathy, cervical region: Secondary | ICD-10-CM | POA: Insufficient documentation

## 2014-07-23 DIAGNOSIS — Z79899 Other long term (current) drug therapy: Secondary | ICD-10-CM | POA: Insufficient documentation

## 2014-07-23 DIAGNOSIS — R52 Pain, unspecified: Secondary | ICD-10-CM

## 2014-07-23 MED ORDER — OXYCODONE-ACETAMINOPHEN 5-325 MG PO TABS: 1.0000 | ORAL_TABLET | Freq: Four times a day (QID) | ORAL | Status: DC | PRN

## 2014-07-23 NOTE — ED Provider Notes (Signed)
CSN: [id number]     Arrival date & time 07/23/14  2034 History  This chart was scribed for Tilden Fossa, MD by Modena Jansky, ED Scribe. This patient was seen in room MHFT1/MHFT1 and the patient's care was started at 9:05 PM.    Chief Complaint  Patient presents with  . Medication Refill   The history is provided by the patient. No language interpreter was used.    HPI Comments: Brett Wallace is a 54 y.o. male who presents to the Emergency Department complaining of a need for a medication refill. He reports that he was seen here for an MRI of his neck. He states that he has constant moderate neck pain that radiates to his bilateral arms, and has been going on for 4 weeks and worsening lately. He reports that he needs something for his pain until he can get an appointment with his surgeon. He reports that he has been taking tylenol for the pain with minimal relief. He states that he has some tingling in his hands. He reports that he has a hx of DM. He denies any fever, diarrhea, numbness, or difficulty ambulating.   Past Medical History  Diagnosis Date  . Hypertension   . Optic neuritis, left   . Blind left eye   . Diabetes mellitus without complication    Past Surgical History  Procedure Laterality Date  . Appendectomy    . Eye surgery    . Neck sugery     History reviewed. No pertinent family history. History  Substance Use Topics  . Smoking status: Current Every Day Smoker  . Smokeless tobacco: Not on file  . Alcohol Use: 2.4 oz/week    4 Cans of beer per week    Review of Systems  Constitutional: Negative for fever.  Gastrointestinal: Negative for nausea.  Musculoskeletal: Positive for neck pain. Negative for gait problem.  Neurological: Negative for weakness.  All other systems reviewed and are negative.   Allergies  Penicillins  Home Medications   Prior to Admission medications   Medication Sig Start Date End Date Taking? Authorizing Provider  allopurinol  (ZYLOPRIM) 100 MG tablet Take 100 mg by mouth daily.    Historical Provider, MD  carvedilol (COREG) 25 MG tablet Take 25 mg by mouth 2 (two) times daily with a meal.    Historical Provider, MD  docusate sodium (COLACE) 100 MG capsule Take 1 capsule (100 mg total) by mouth every 12 (twelve) hours. 02/28/14   Candyce Churn III, MD  glipiZIDE (GLUCOTROL) 5 MG tablet Take by mouth daily before breakfast.    Historical Provider, MD  hydrochlorothiazide (HYDRODIURIL) 25 MG tablet Take 25 mg by mouth daily.    Historical Provider, MD  ibuprofen (ADVIL,MOTRIN) 200 MG tablet Take 3 tablets (600 mg total) by mouth 3 (three) times daily. 02/28/14   Candyce Churn III, MD  lisinopril (PRINIVIL,ZESTRIL) 10 MG tablet Take 10 mg by mouth daily.      Historical Provider, MD  metFORMIN (GLUCOPHAGE) 1000 MG tablet Take 1,000 mg by mouth 2 (two) times daily with a meal.    Historical Provider, MD  oxyCODONE-acetaminophen (PERCOCET/ROXICET) 5-325 MG per tablet Take 2 tablets by mouth every 4 (four) hours as needed for moderate pain or severe pain. 07/18/14   Nelia Shi, MD  pravastatin (PRAVACHOL) 10 MG tablet Take 10 mg by mouth daily.    Historical Provider, MD  traZODone (DESYREL) 50 MG tablet Take 50 mg by mouth at bedtime.  Historical Provider, MD   BP 165/93 mmHg  Pulse 92  Temp(Src) 97.8 F (36.6 C) (Oral)  Resp 18  Ht  (1.778 m)  Wt 268 lb (121.564 kg)  BMI 38.45 kg/m2  SpO2 99% Physical Exam  Constitutional: He is oriented to person, place, and time. He appears well-developed and well-nourished.  HENT:  Head: Normocephalic and atraumatic.  Neck:  Diffuse cervical tenderness to palpation, greatest over lower cspine.   Cardiovascular: Normal rate and regular rhythm.   Pulmonary/Chest: Effort normal and breath sounds normal. No respiratory distress.  Musculoskeletal: Normal range of motion. He exhibits no edema or tenderness.  Neurological: He is alert and oriented to person,  place, and time.  5/5 strength in BUE.  Sensation to light touch intact in bue.   Skin: Skin is warm and dry.  Psychiatric: He has a normal mood and affect. His behavior is normal.  Nursing note and vitals reviewed.   ED Course  Procedures (including critical care time) DIAGNOSTIC STUDIES: Oxygen Saturation is 99% on RA, normal by my interpretation.    COORDINATION OF CARE: 9:09 PM- Pt advised of plan for treatment which includes medication and pt agrees.  Labs Review Labs Reviewed - No data to display  Imaging Review Mr Cervical Spine Wo Contrast  07/23/2014   CLINICAL DATA:  Neck pain extending into the upper extremities bilaterally for 5 weeks. Numbness in the hands. Personal history of cervical spine fusion.  EXAM: MRI CERVICAL SPINE WITHOUT CONTRAST  TECHNIQUE: Multiplanar, multisequence MR imaging of the cervical spine was performed. No intravenous contrast was administered.  COMPARISON:  Cervical spine radiographs 07/18/2014. Cervical spine myelogram 05/01/2009  FINDINGS: Normal signal is present in the cervical and upper thoracic spinal cord to the lowest imaged level, T1-2.  The craniocervical junction is within normal limits. The visualized intracranial contents are normal. T1 marrow signal is diffusely depressed. The patient is fused anteriorly at C6-7.  Flow is present in the vertebral arteries bilaterally.  C2-3: Mild left-sided uncovertebral spurring and foraminal narrowing is evident.  C3-4: A broad-based disc osteophyte complex slightly distorts the ventral surface the cord. The canal is narrowed to 8 mm in the midline. Uncovertebral spurring results in moderate foraminal stenosis bilaterally.  C4-5: A broad-based disc osteophyte complex is present. There is a focal more severe left central protrusion which severely narrows the left central portion of the canal. Moderate foraminal stenosis is secondary to uncovertebral spurring bilaterally.  C5-6: A broad-based disc osteophyte  complex effaces the ventral CSF. Asymmetric right-sided uncovertebral spurring is present. Moderate right and mild left foraminal stenosis is evident.  C6-7: The patient is fused anteriorly. There is no significant residual or recurrent stenosis.  C7-T1:  Negative.  IMPRESSION: 1. The most severe disease is at C4-5 where a focal left paramedian disc protrusion is present in association with a significant broad-based disc osteophyte complex. This results in severe left central canal stenosis. 2. Erect foraminal stenosis bilaterally at C3-4 and C4-5. 3. Moderate central canal stenosis at C3-4. The canal is narrowed 8 mm. 4. Mild left foraminal narrowing at C2-3. 5. Moderate right and mild left foraminal narrowing at C5-6. 6. Anterior fusion at C6-7 without significant residual or recurrent stenosis.   Electronically Signed   By: Gennette Pac M.D.   On: 07/23/2014 19:59     EKG Interpretation None      MDM   Final diagnoses:  Neck pain  Radiculopathy of cervical spine   Patient with chronic neck pain  here with continued pain after MRI performed today. Reviewed results of MRI with patient and recommends close neurosurgery follow-up. Patient without deficits in the emergency department, clinical picture is not consistent with epidural abscess. Discussed with patient close outpatient follow-up. Provided a prescription for Percocet, discussed PCP follow-up.  I personally performed the services described in this documentation, which was scribed in my presence. The recorded information has been reviewed and is accurate.     Tilden Fossa, MD 07/24/14 0020

## 2014-07-23 NOTE — ED Notes (Signed)
Pt reports he was here for MRI of his neck - reports increased neck pain, states he was on Ultram but no effect was noted. States currently out of Ultram.

## 2014-07-23 NOTE — Discharge Instructions (Signed)

## 2014-09-29 ENCOUNTER — Telehealth: Payer: Self-pay | Admitting: *Deleted

## 2014-09-29 NOTE — Telephone Encounter (Signed)
Signed medical record release received from [employer]. Forwarded to [place] to scan/fax to medical records. JG//CMA

## 2015-01-22 ENCOUNTER — Encounter (HOSPITAL_BASED_OUTPATIENT_CLINIC_OR_DEPARTMENT_OTHER): Payer: Self-pay | Admitting: *Deleted

## 2015-01-22 ENCOUNTER — Emergency Department (HOSPITAL_BASED_OUTPATIENT_CLINIC_OR_DEPARTMENT_OTHER)
Admission: EM | Admit: 2015-01-22 | Discharge: 2015-01-22 | Disposition: A | Discharge status: Home / Self Care | Payer: Medicaid Other | Attending: Emergency Medicine | Admitting: Emergency Medicine

## 2015-01-22 DIAGNOSIS — Z79899 Other long term (current) drug therapy: Secondary | ICD-10-CM | POA: Insufficient documentation

## 2015-01-22 DIAGNOSIS — E119 Type 2 diabetes mellitus without complications: Secondary | ICD-10-CM | POA: Insufficient documentation

## 2015-01-22 DIAGNOSIS — Z720 Tobacco use: Secondary | ICD-10-CM | POA: Insufficient documentation

## 2015-01-22 DIAGNOSIS — I10 Essential (primary) hypertension: Secondary | ICD-10-CM | POA: Insufficient documentation

## 2015-01-22 DIAGNOSIS — Z880 Allergy status to penicillin: Secondary | ICD-10-CM | POA: Insufficient documentation

## 2015-01-22 DIAGNOSIS — M542 Cervicalgia: Secondary | ICD-10-CM

## 2015-01-22 DIAGNOSIS — H5442 Blindness, left eye, normal vision right eye: Secondary | ICD-10-CM | POA: Insufficient documentation

## 2015-01-22 MED ORDER — HYDROCODONE-ACETAMINOPHEN 5-325 MG PO TABS
2.0000 | ORAL_TABLET | ORAL | Status: DC | PRN
Start: 2015-01-22 — End: 2017-06-24

## 2015-01-22 MED ORDER — HYDROMORPHONE HCL 1 MG/ML IJ SOLN
1.0000 mg | Freq: Once | INTRAMUSCULAR | Status: AC
  Administered 2015-01-22: 1 mg via INTRAMUSCULAR
  Filled 2015-01-22: qty 1

## 2015-01-22 MED ORDER — KETOROLAC TROMETHAMINE 60 MG/2ML IM SOLN
60.0000 mg | Freq: Once | INTRAMUSCULAR | Status: AC
  Administered 2015-01-22: 60 mg via INTRAMUSCULAR
  Filled 2015-01-22: qty 2

## 2015-01-22 NOTE — ED Notes (Signed)
Neck pain x several months.  Pt had an MRI in Jan for same.  Pt reports that he is trying to get disability.

## 2015-01-22 NOTE — ED Provider Notes (Signed)
CSN: [id number]     Arrival date & time 01/22/15  1352 History   First MD Initiated Contact with Patient 01/22/15 1357     Chief Complaint  Patient presents with  . Neck Pain     (Consider location/radiation/quality/duration/timing/severity/associated sxs/prior Treatment) Patient is a 54 y.o. male presenting with neck pain. The history is provided by the patient. No language interpreter was used.  Neck Pain Pain location:  Generalized neck Quality:  Aching Pain radiates to:  L arm Pain severity:  Severe Pain is:  Same all the time Onset quality:  Gradual Progression:  Worsening Chronicity:  New Relieved by:  Nothing Worsened by:  Nothing tried Associated symptoms: no fever   Risk factors: no hx of head and neck radiation   Pt complains of pain in his neck.  Pt had mri in January.  He is scheduled to see neurosurgeon in 2 weeks.  Pt is taking tramadol and flexeril with no relief  Past Medical History  Diagnosis Date  . Hypertension   . Optic neuritis, left   . Blind left eye   . Diabetes mellitus without complication    Past Surgical History  Procedure Laterality Date  . Appendectomy    . Eye surgery    . Neck sugery     History reviewed. No pertinent family history. History  Substance Use Topics  . Smoking status: Current Every Day Smoker -- 0.50 packs/day    Types: Cigarettes  . Smokeless tobacco: Not on file  . Alcohol Use: No    Review of Systems  Constitutional: Negative for fever.  Musculoskeletal: Positive for neck pain.  All other systems reviewed and are negative.     Allergies  Penicillins  Home Medications   Prior to Admission medications   Medication Sig Start Date End Date Taking? Authorizing Provider  allopurinol (ZYLOPRIM) 100 MG tablet Take 100 mg by mouth daily.    Historical Provider, MD  carvedilol (COREG) 25 MG tablet Take 25 mg by mouth 2 (two) times daily with a meal.    Historical Provider, MD  glipiZIDE (GLUCOTROL) 5 MG tablet  Take 10 mg by mouth daily before breakfast.     Historical Provider, MD  hydrochlorothiazide (HYDRODIURIL) 25 MG tablet Take 25 mg by mouth daily.    Historical Provider, MD  lisinopril (PRINIVIL,ZESTRIL) 10 MG tablet Take 10 mg by mouth daily.      Historical Provider, MD  metFORMIN (GLUCOPHAGE) 1000 MG tablet Take 1,000 mg by mouth 2 (two) times daily with a meal.    Historical Provider, MD  pravastatin (PRAVACHOL) 10 MG tablet Take 10 mg by mouth daily.    Historical Provider, MD  traZODone (DESYREL) 50 MG tablet Take 50 mg by mouth at bedtime.    Historical Provider, MD   BP 114/59 mmHg  Pulse 100  Temp(Src) 98.5 F (36.9 C) (Oral)  Resp 18  Ht 5\' 10"  (1.778 m)  Wt 265 lb (120.203 kg)  BMI 38.02 kg/m2  SpO2 96% Physical Exam  Constitutional: He is oriented to person, place, and time. He appears well-developed and well-nourished.  HENT:  Head: Normocephalic.  Eyes: EOM are normal. Pupils are equal, round, and reactive to light.  Neck: Normal range of motion.  Diffusely tender c spine,  Limited range of motion neck,    Cardiovascular: Normal rate.   Pulmonary/Chest: Effort normal.  Abdominal: He exhibits no distension.  Musculoskeletal: Normal range of motion.  Neurological: He is alert and oriented to person, place, and  time.  Skin: Skin is warm.  Psychiatric: He has a normal mood and affect.  Nursing note and vitals reviewed.   ED Course  Procedures (including critical care time) Labs Review Labs Reviewed - No data to display  Imaging Review No results found.   EKG Interpretation None      MDM   Final diagnoses:  Neck pain    Pt given torodol and dilaudid IM Rx for hydrocodone Pt advised to call his MD on Tuesday to discuss pain management.    Lonia Skinner Fallis, PA-C 01/22/15 1458  Doug Sou, MD 01/22/15 [phone]

## 2015-01-22 NOTE — Discharge Instructions (Signed)

## 2017-04-20 ENCOUNTER — Emergency Department (HOSPITAL_BASED_OUTPATIENT_CLINIC_OR_DEPARTMENT_OTHER)
Admission: EM | Admit: 2017-04-20 | Discharge: 2017-04-20 | Disposition: A | Discharge status: Home / Self Care | Payer: Medicare Other | Attending: Emergency Medicine | Admitting: Emergency Medicine

## 2017-04-20 ENCOUNTER — Encounter (HOSPITAL_BASED_OUTPATIENT_CLINIC_OR_DEPARTMENT_OTHER): Payer: Self-pay | Admitting: Emergency Medicine

## 2017-04-20 DIAGNOSIS — Z79899 Other long term (current) drug therapy: Secondary | ICD-10-CM | POA: Diagnosis not present

## 2017-04-20 DIAGNOSIS — Z7984 Long term (current) use of oral hypoglycemic drugs: Secondary | ICD-10-CM | POA: Diagnosis not present

## 2017-04-20 DIAGNOSIS — G8929 Other chronic pain: Secondary | ICD-10-CM | POA: Diagnosis not present

## 2017-04-20 DIAGNOSIS — I10 Essential (primary) hypertension: Secondary | ICD-10-CM | POA: Insufficient documentation

## 2017-04-20 DIAGNOSIS — Z7902 Long term (current) use of antithrombotics/antiplatelets: Secondary | ICD-10-CM | POA: Insufficient documentation

## 2017-04-20 DIAGNOSIS — M545 Low back pain, unspecified: Secondary | ICD-10-CM

## 2017-04-20 DIAGNOSIS — F17210 Nicotine dependence, cigarettes, uncomplicated: Secondary | ICD-10-CM | POA: Insufficient documentation

## 2017-04-20 DIAGNOSIS — E119 Type 2 diabetes mellitus without complications: Secondary | ICD-10-CM | POA: Diagnosis not present

## 2017-04-20 DIAGNOSIS — M5412 Radiculopathy, cervical region: Secondary | ICD-10-CM | POA: Insufficient documentation

## 2017-04-20 MED ORDER — ORPHENADRINE CITRATE ER 100 MG PO TB12
100.0000 mg | ORAL_TABLET | Freq: Two times a day (BID) | ORAL | 0 refills | Status: DC
Start: 2017-04-20 — End: 2017-06-24

## 2017-04-20 MED ORDER — HYDROMORPHONE HCL 1 MG/ML IJ SOLN
1.0000 mg | Freq: Once | INTRAMUSCULAR | Status: AC
  Administered 2017-04-20: 1 mg via INTRAMUSCULAR
  Filled 2017-04-20: qty 1

## 2017-04-20 MED ORDER — KETOROLAC TROMETHAMINE 60 MG/2ML IM SOLN
60.0000 mg | Freq: Once | INTRAMUSCULAR | Status: AC
  Administered 2017-04-20: 60 mg via INTRAMUSCULAR
  Filled 2017-04-20: qty 2

## 2017-04-20 MED ORDER — PREDNISONE 20 MG PO TABS: ORAL_TABLET | ORAL | 0 refills | Status: DC

## 2017-04-20 MED ORDER — TRAMADOL HCL 50 MG PO TABS: 100.0000 mg | ORAL_TABLET | Freq: Four times a day (QID) | ORAL | 0 refills | Status: DC | PRN

## 2017-04-20 NOTE — ED Provider Notes (Signed)
[facility] Provider Note   CSN: [id number] Arrival date & time: 04/20/17  0920     History   Chief Complaint Chief Complaint  Patient presents with  . Back Pain    HPI Brett Wallace is a 56 y.o. male.  HPI Patient has had problem with ongoing chronic and severe back pain. He reports the pain is consistent with his usual pain but has gotten worse over the past week. Pain is in his neck as well as his lower back. Pain radiates into both arms and both legs. He reports he has had epidural injections in the past and they have not helped. He reports he frequently does not get much relief from his pain. He has a neurosurgeon that he sees in a family doctor. Patient has known chronic weakness by review of EMR. Patient does not report that he is more weak than usual except because he is in pain. No chest pain, no abdominal pain, no headache. Past Medical History:  Diagnosis Date  . Blind left eye   . Diabetes mellitus without complication (HCC)   . Hypertension   . Optic neuritis, left     There are no active problems to display for this patient.   Past Surgical History:  Procedure Laterality Date  . APPENDECTOMY    . EYE SURGERY    . neck sugery         Home Medications    Prior to Admission medications   Medication Sig Start Date End Date Taking? Authorizing Provider  cyclobenzaprine (FLEXERIL) 10 MG tablet Take 10 mg by mouth 3 (three) times daily as needed for muscle spasms.   Yes [provider]  gabapentin (NEURONTIN) 300 MG capsule Take 300 mg by mouth 3 (three) times daily.   Yes [provider]  hydroxychloroquine (PLAQUENIL) 200 MG tablet Take 200 mg by mouth daily.   Yes [provider]  tiZANidine (ZANAFLEX) 4 MG capsule Take 4 mg by mouth 3 (three) times daily.   Yes [provider]  allopurinol (ZYLOPRIM) 100 MG tablet Take 100 mg by mouth daily.    [provider]  carvedilol (COREG) 25 MG tablet Take 25  mg by mouth 2 (two) times daily with a meal.    [provider]  glipiZIDE (GLUCOTROL) 5 MG tablet Take 10 mg by mouth daily before breakfast.     [provider]  hydrochlorothiazide (HYDRODIURIL) 25 MG tablet Take 25 mg by mouth daily.    [provider]  HYDROcodone-acetaminophen (NORCO/VICODIN) 5-325 MG per tablet Take 2 tablets by mouth every 4 (four) hours as needed. 01/22/15   Elson Areas, PA-C  lisinopril (PRINIVIL,ZESTRIL) 10 MG tablet Take 10 mg by mouth daily.      [provider]  metFORMIN (GLUCOPHAGE) 1000 MG tablet Take 1,000 mg by mouth 2 (two) times daily with a meal.    [provider]  orphenadrine (NORFLEX) 100 MG tablet Take 1 tablet (100 mg total) by mouth 2 (two) times daily. 04/20/17   Arby Barrette, MD  pravastatin (PRAVACHOL) 10 MG tablet Take 10 mg by mouth daily.    [provider]  predniSONE (DELTASONE) 20 MG tablet 2 tabs po daily x 4 days 04/20/17   Arby Barrette, MD  traMADol (ULTRAM) 50 MG tablet Take 2 tablets (100 mg total) by mouth every 6 (six) hours as needed. 04/20/17   Arby Barrette, MD  traZODone (DESYREL) 50 MG tablet Take 50 mg by mouth at bedtime.  [provider]    Family History No family history on file.  Social History Social History  Substance Use Topics  . Smoking status: Current Every Day Smoker    Packs/day: 0.50    Types: Cigarettes  . Smokeless tobacco: Never Used  . Alcohol use No     Allergies   Penicillins   Review of Systems Review of Systems 10 Systems reviewed and are negative for acute change except as noted in the HPI.   Physical Exam Updated Vital Signs BP (!) 141/80 (BP Location: Left Arm)   Pulse 82   Temp 98 F (36.7 C) (Oral)   Resp 16   Ht  (1.778 m)   Wt 117 kg (258 lb)   SpO2 100%   BMI 37.02 kg/m   Physical Exam  Constitutional: He is oriented to person, place, and time. He appears well-developed and well-nourished.    Patient is alert and nontoxic. He is sitting in the wheelchair. No respiratory distress. Calm and appropriately interactive.  HENT:  Head: Normocephalic and atraumatic.  Eyes: Conjunctivae and EOM are normal.  Neck: Neck supple.  Patient endorses tenderness throughout palpation of the cervical spine. He endorses tenderness of the paraspinous muscle bodies.  Cardiovascular: Normal rate, regular rhythm and intact distal pulses.   No murmur heard. Pulmonary/Chest: Effort normal and breath sounds normal. No respiratory distress.  Abdominal: Soft. He exhibits no distension. There is no tenderness. There is no guarding.  Musculoskeletal: He exhibits no edema.  Patient endorses tenderness to palpation throughout his back. It does not localize over the bony prominences. Patient does not have any significant peripheral edema. He does have some skin thinning and old scars over the legs. No acute cellulitis. Some atrophy of calf muscles.  Neurological: He is alert and oriented to person, place, and time. No cranial nerve deficit.  Bilateral grip strength 5\5. Lower extremities patient can extend against resistance. Normal cognitive function and speech.  Skin: Skin is warm and dry.  Psychiatric: He has a normal mood and affect.  Nursing note and vitals reviewed.    ED Treatments / Results  Labs (all labs ordered are listed, but only abnormal results are displayed) Labs Reviewed - No data to display  EKG  EKG Interpretation None       Radiology No results found.  Procedures Procedures (including critical care time)  Medications Ordered in ED Medications  ketorolac (TORADOL) injection 60 mg (60 mg Intramuscular Given 04/20/17 1023)  HYDROmorphone (DILAUDID) injection 1 mg (1 mg Intramuscular Given 04/20/17 1023)     Initial Impression / Assessment and Plan / ED Course  I have reviewed the triage vital signs and the nursing notes.  Pertinent labs & imaging results that were available  during my care of the patient were reviewed by me and considered in my medical decision making (see chart for details).      Final Clinical Impressions(s) / ED Diagnoses   Final diagnoses:  Chronic bilateral low back pain without sciatica  Chronic radicular cervical pain  Patient desribes acute exacerbation of chronic pain. He has chronic weakness and does not describe any new areas of weakness. Neurologic examination is intact. At this time will treat for exacerbation of chronic pain with one  dose of Toradol and Dilaudid in the emergency department. Patient is given a short burst of prednisone, norflex and tramadol for acute on chronic pain. Instructed to follow up with his neurosurgeon and PCP. New Prescriptions New Prescriptions   ORPHENADRINE (  NORFLEX) 100 MG TABLET    Take 1 tablet (100 mg total) by mouth 2 (two) times daily.   PREDNISONE (DELTASONE) 20 MG TABLET    2 tabs po daily x 4 days   TRAMADOL (ULTRAM) 50 MG TABLET    Take 2 tablets (100 mg total) by mouth every 6 (six) hours as needed.     Arby Barrette, MD 04/20/17 1045

## 2017-04-20 NOTE — ED Triage Notes (Signed)
Patient states that he has chronic back pain. REports that he has had pain to his lower back x 1 week. Unable to tolerate now

## 2017-06-24 ENCOUNTER — Other Ambulatory Visit: Payer: Self-pay

## 2017-06-24 ENCOUNTER — Encounter (HOSPITAL_BASED_OUTPATIENT_CLINIC_OR_DEPARTMENT_OTHER): Payer: Self-pay | Admitting: Emergency Medicine

## 2017-06-24 ENCOUNTER — Emergency Department (HOSPITAL_BASED_OUTPATIENT_CLINIC_OR_DEPARTMENT_OTHER)
Admission: EM | Admit: 2017-06-24 | Discharge: 2017-06-24 | Disposition: A | Discharge status: Home / Self Care | Payer: Medicare Other | Attending: Emergency Medicine | Admitting: Emergency Medicine

## 2017-06-24 DIAGNOSIS — M542 Cervicalgia: Secondary | ICD-10-CM | POA: Insufficient documentation

## 2017-06-24 DIAGNOSIS — Z79899 Other long term (current) drug therapy: Secondary | ICD-10-CM | POA: Insufficient documentation

## 2017-06-24 DIAGNOSIS — I10 Essential (primary) hypertension: Secondary | ICD-10-CM | POA: Insufficient documentation

## 2017-06-24 DIAGNOSIS — M545 Low back pain: Secondary | ICD-10-CM | POA: Diagnosis present

## 2017-06-24 DIAGNOSIS — E119 Type 2 diabetes mellitus without complications: Secondary | ICD-10-CM | POA: Insufficient documentation

## 2017-06-24 DIAGNOSIS — Z7984 Long term (current) use of oral hypoglycemic drugs: Secondary | ICD-10-CM | POA: Insufficient documentation

## 2017-06-24 DIAGNOSIS — G8929 Other chronic pain: Secondary | ICD-10-CM | POA: Insufficient documentation

## 2017-06-24 DIAGNOSIS — F17210 Nicotine dependence, cigarettes, uncomplicated: Secondary | ICD-10-CM | POA: Diagnosis not present

## 2017-06-24 DIAGNOSIS — M5442 Lumbago with sciatica, left side: Secondary | ICD-10-CM | POA: Insufficient documentation

## 2017-06-24 LAB — CBG MONITORING, ED: Glucose-Capillary: 131 mg/dL — ABNORMAL HIGH (ref 65–99)

## 2017-06-24 MED ORDER — TRAMADOL HCL 50 MG PO TABS: 50.0000 mg | ORAL_TABLET | Freq: Four times a day (QID) | ORAL | 0 refills | Status: DC | PRN

## 2017-06-24 MED ORDER — ORPHENADRINE CITRATE ER 100 MG PO TB12: 100.0000 mg | ORAL_TABLET | Freq: Two times a day (BID) | ORAL | 0 refills | Status: DC

## 2017-06-24 MED ORDER — KETOROLAC TROMETHAMINE 60 MG/2ML IM SOLN
30.0000 mg | Freq: Once | INTRAMUSCULAR | Status: AC
  Administered 2017-06-24: 30 mg via INTRAMUSCULAR
  Filled 2017-06-24: qty 2

## 2017-06-24 MED ORDER — DEXAMETHASONE SODIUM PHOSPHATE 10 MG/ML IJ SOLN
10.0000 mg | Freq: Once | INTRAMUSCULAR | Status: AC
  Administered 2017-06-24: 10 mg via INTRAMUSCULAR
  Filled 2017-06-24: qty 1

## 2017-06-24 NOTE — ED Provider Notes (Signed)
[facility] EMERGENCY DEPARTMENT Provider Note   CSN: [id number] Arrival date & time: 06/24/17  1729     History   Chief Complaint Chief Complaint  Patient presents with  . Back Pain    HPI Brett Wallace is a 56 y.o. male who presents with acute on chronic low back pain. PMH significant for neurosarcodosis, chronic pain, non-insulin dependent DM, HTN, blindness in the left eye. The patient states that he has had a worsening of his chronic neck and low back pain. He denies injury. He states is followed by neurology and neurosurgery but couldn't get an appointment with his doctors today. He states his pain radiates from his low back down his left leg to his toes. The pain is constant. This is consistent with prior exacerbations. No saddle anesthesia, leg weakness, bowel/bladder incontinence or urinary retention. He has had prior injections in the back which have not helped. He is not currently in pain management.  HPI  Past Medical History:  Diagnosis Date  . Blind left eye   . Diabetes mellitus without complication (HCC)   . Hypertension   . Optic neuritis, left     There are no active problems to display for this patient.   Past Surgical History:  Procedure Laterality Date  . APPENDECTOMY    . EYE SURGERY    . neck sugery         Home Medications    Prior to Admission medications   Medication Sig Start Date End Date Taking? Authorizing Provider  allopurinol (ZYLOPRIM) 100 MG tablet Take 100 mg by mouth daily.    [provider]  carvedilol (COREG) 25 MG tablet Take 25 mg by mouth 2 (two) times daily with a meal.    [provider]  cyclobenzaprine (FLEXERIL) 10 MG tablet Take 10 mg by mouth 3 (three) times daily as needed for muscle spasms.    [provider]  gabapentin (NEURONTIN) 300 MG capsule Take 300 mg by mouth 3 (three) times daily.    [provider]  glipiZIDE (GLUCOTROL) 5 MG tablet Take 10 mg by mouth daily  before breakfast.     [provider]  hydrochlorothiazide (HYDRODIURIL) 25 MG tablet Take 25 mg by mouth daily.    [provider]  hydroxychloroquine (PLAQUENIL) 200 MG tablet Take 200 mg by mouth daily.    [provider]  lisinopril (PRINIVIL,ZESTRIL) 10 MG tablet Take 10 mg by mouth daily.      [provider]  metFORMIN (GLUCOPHAGE) 1000 MG tablet Take 1,000 mg by mouth 2 (two) times daily with a meal.    [provider]  orphenadrine (NORFLEX) 100 MG tablet Take 1 tablet (100 mg total) by mouth 2 (two) times daily. 04/20/17   Arby BarrettePfeiffer, Marcy, MD  pravastatin (PRAVACHOL) 10 MG tablet Take 10 mg by mouth daily.    [provider]  predniSONE (DELTASONE) 20 MG tablet 2 tabs po daily x 4 days 04/20/17   Arby BarrettePfeiffer, Marcy, MD  tiZANidine (ZANAFLEX) 4 MG capsule Take 4 mg by mouth 3 (three) times daily.    [provider]  traZODone (DESYREL) 50 MG tablet Take 50 mg by mouth at bedtime.    [provider]    Family History History reviewed. No pertinent family history.  Social History Social History   Tobacco Use  . Smoking status: Current Every Day Smoker    Packs/day: 0.50    Types: Cigarettes  . Smokeless tobacco: Never Used  Substance  Use Topics  . Alcohol use: No  . Drug use: No     Allergies   Penicillins   Review of Systems Review of Systems  Constitutional: Negative for fever.  Gastrointestinal: Negative for abdominal pain.  Genitourinary: Negative for difficulty urinating.  Musculoskeletal: Positive for back pain, myalgias and neck pain. Negative for gait problem.  Neurological: Positive for numbness. Negative for weakness.  All other systems reviewed and are negative.    Physical Exam Updated Vital Signs BP (!) 175/91 (BP Location: Left Arm)   Pulse 91   Temp 98.4 F (36.9 C) (Oral)   Resp 20   Ht 5' 10.5" (1.791 m)   Wt 116.1 kg (256 lb)   SpO2 98%   BMI 36.21 kg/m   Physical Exam    Constitutional: He is oriented to person, place, and time. He appears well-developed and well-nourished. No distress.  HENT:  Head: Normocephalic and atraumatic.  Eyes: Conjunctivae are normal. Pupils are equal, round, and reactive to light. Right eye exhibits no discharge. Left eye exhibits no discharge. No scleral icterus.  Neck: Normal range of motion.  Cervical midline tenderness  Cardiovascular: Normal rate.  Pulmonary/Chest: Effort normal. No respiratory distress.  Abdominal: He exhibits no distension.  Musculoskeletal:  Back: Inspection: No masses, deformity, or rash Palpation: Lumbar midline spinal tenderness with paraspinal muscle tenderness. Strength: 5/5 in lower extremities and normal plantar and dorsiflexion Sensation: Intact sensation with light touch in lower extremities bilaterally SLR: Negative seated straight leg raise Gait: Ambulatory with a limp   Neurological: He is alert and oriented to person, place, and time.  Skin: Skin is warm and dry.  Psychiatric: He has a normal mood and affect. His behavior is normal.  Nursing note and vitals reviewed.    ED Treatments / Results  Labs (all labs ordered are listed, but only abnormal results are displayed) Labs Reviewed  CBG MONITORING, ED - Abnormal; Notable for the following components:      Result Value   Glucose-Capillary 131 (*)    All other components within normal limits    EKG  EKG Interpretation None       Radiology No results found.  Procedures Procedures (including critical care time)  Medications Ordered in ED Medications - No data to display   Initial Impression / Assessment and Plan / ED Course  I have reviewed the triage vital signs and the nursing notes.  Pertinent labs & imaging results that were available during my care of the patient were reviewed by me and considered in my medical decision making (see chart for details).  56 year old male with acute on chronic neck and low back  pain. He is hypertensive but otherwise has normal vitals. He has normal strength and is ambulatory. No symptoms of cauda equina. CBG is 131 today. Will give one dose of Decadron and Toradol in the ED. Will rx Tramadol and Norflex for home since he states this has helped in the past. Advised f/u with his doctor.   Final Clinical Impressions(s) / ED Diagnoses   Final diagnoses:  Chronic neck pain  Chronic bilateral low back pain with left-sided sciatica    ED Discharge Orders    None       Beryle QuantGekas, Chihiro Frey Marie, PA-C 06/24/17 Gala [id number]    Pfeiffer, Marcy, MD 06/26/17 [phone]

## 2017-06-24 NOTE — Discharge Instructions (Signed)
Take Tramadol for pain as needed Take Norflex (muscle relaxer) for pain as needed Please follow up with your doctor

## 2017-06-24 NOTE — ED Notes (Signed)
Pt discharged to home NAD steady gait. 

## 2017-06-24 NOTE — ED Triage Notes (Signed)
Patient states that he is having pain to his neck and back. He reports "sciatic nerve problems"  - patient walks with a limp

## 2019-05-04 ENCOUNTER — Emergency Department (HOSPITAL_BASED_OUTPATIENT_CLINIC_OR_DEPARTMENT_OTHER)
Admission: EM | Admit: 2019-05-04 | Discharge: 2019-05-04 | Disposition: A | Discharge status: Home / Self Care | Payer: Medicare Other | Attending: Emergency Medicine | Admitting: Emergency Medicine

## 2019-05-04 ENCOUNTER — Other Ambulatory Visit: Payer: Self-pay

## 2019-05-04 ENCOUNTER — Encounter (HOSPITAL_BASED_OUTPATIENT_CLINIC_OR_DEPARTMENT_OTHER): Payer: Self-pay

## 2019-05-04 DIAGNOSIS — Z7984 Long term (current) use of oral hypoglycemic drugs: Secondary | ICD-10-CM | POA: Diagnosis not present

## 2019-05-04 DIAGNOSIS — F17210 Nicotine dependence, cigarettes, uncomplicated: Secondary | ICD-10-CM | POA: Insufficient documentation

## 2019-05-04 DIAGNOSIS — I10 Essential (primary) hypertension: Secondary | ICD-10-CM | POA: Diagnosis not present

## 2019-05-04 DIAGNOSIS — Z79899 Other long term (current) drug therapy: Secondary | ICD-10-CM | POA: Diagnosis not present

## 2019-05-04 DIAGNOSIS — M5432 Sciatica, left side: Secondary | ICD-10-CM | POA: Insufficient documentation

## 2019-05-04 DIAGNOSIS — E119 Type 2 diabetes mellitus without complications: Secondary | ICD-10-CM | POA: Diagnosis not present

## 2019-05-04 DIAGNOSIS — M545 Low back pain: Secondary | ICD-10-CM | POA: Diagnosis present

## 2019-05-04 MED ORDER — KETOROLAC TROMETHAMINE 15 MG/ML IJ SOLN
15.0000 mg | Freq: Once | INTRAMUSCULAR | Status: AC
  Administered 2019-05-04: 15 mg via INTRAMUSCULAR
  Filled 2019-05-04: qty 1

## 2019-05-04 MED ORDER — DIAZEPAM 5 MG PO TABS
5.0000 mg | ORAL_TABLET | Freq: Once | ORAL | Status: AC
  Administered 2019-05-04: 5 mg via ORAL
  Filled 2019-05-04: qty 1

## 2019-05-04 MED ORDER — OXYCODONE HCL 5 MG PO TABS
5.0000 mg | ORAL_TABLET | Freq: Once | ORAL | Status: AC
Start: 2019-05-04 — End: 2019-05-04
  Administered 2019-05-04: 5 mg via ORAL
  Filled 2019-05-04: qty 1

## 2019-05-04 MED ORDER — ACETAMINOPHEN 500 MG PO TABS
1000.0000 mg | ORAL_TABLET | Freq: Once | ORAL | Status: AC
Start: 2019-05-04 — End: 2019-05-04
  Administered 2019-05-04: 22:00:00 1000 mg via ORAL
  Filled 2019-05-04: qty 2

## 2019-05-04 NOTE — ED Triage Notes (Addendum)
Pt c/o chronic pain to neck,back and bilat legs-pt was seen by PCP today-states "nothing she can do about it"-pt states he also sees an ortho MD for c/o-last visit 1.5 months ago per pt-to triage in w/c-NAD

## 2019-05-04 NOTE — ED Provider Notes (Signed)
[facility] EMERGENCY DEPARTMENT Provider Note   CSN: [id number] Arrival date & time: 05/04/19  2031     History   Chief Complaint Chief Complaint  Patient presents with   Pain    HPI Brett Wallace is a 58 y.o. male.     58 yo M with a chief complaint of left-sided low back pain.  This is a chronic issue for him.  He feels it is worsened over the past week or so.  He saw his family doctor today and had no change of his home medications.  He is upset because he feels that the pain is been keeping him from sleeping.  Worse with movement palpation and ambulation.  He denies fevers or chills denies loss of bowel or bladder denies loss of peritoneal sensation denies recent trauma.  He has not had a spinal injection in some time nor is he had recent surgery.  He states that he is also upset with his current care from his neurosurgeon and he would like to follow-up with Dr. Shon Baton who he had seen in the remote past.  The history is provided by the patient.  Back Pain Location:  Lumbar spine Quality:  Burning and shooting Radiates to:  L foot Pain severity:  Moderate Pain is:  Same all the time Onset quality:  Gradual Duration:  2 weeks Timing:  Constant Progression:  Worsening Chronicity:  Chronic Context: not recent injury   Relieved by:  Nothing Worsened by:  Nothing Ineffective treatments:  None tried Associated symptoms: no abdominal pain, no bladder incontinence, no bowel incontinence, no chest pain, no fever, no headaches, no leg pain, no numbness, no paresthesias, no pelvic pain and no perianal numbness     Past Medical History:  Diagnosis Date   Blind left eye    Diabetes mellitus without complication (HCC)    Hypertension    Optic neuritis, left     There are no active problems to display for this patient.   Past Surgical History:  Procedure Laterality Date   APPENDECTOMY     CARPAL TUNNEL RELEASE     ELBOW SURGERY     EYE SURGERY      neck sugery     WRIST SURGERY          Home Medications    Prior to Admission medications   Medication Sig Start Date End Date Taking? Authorizing Provider  cyclobenzaprine (FLEXERIL) 10 MG tablet Take by mouth. 05/04/19 05/14/19 Yes [provider]  allopurinol (ZYLOPRIM) 100 MG tablet Take 100 mg by mouth daily.    [provider]  carvedilol (COREG) 25 MG tablet Take 25 mg by mouth 2 (two) times daily with a meal.    [provider]  cyclobenzaprine (FLEXERIL) 10 MG tablet Take 10 mg by mouth 3 (three) times daily as needed for muscle spasms.    [provider]  furosemide (LASIX) 20 MG tablet  05/04/19   [provider]  gabapentin (NEURONTIN) 300 MG capsule Take 300 mg by mouth 3 (three) times daily.    [provider]  glipiZIDE (GLUCOTROL) 5 MG tablet Take 10 mg by mouth daily before breakfast.     [provider]  hydrochlorothiazide (HYDRODIURIL) 25 MG tablet Take 25 mg by mouth daily.    [provider]  HYDROcodone-acetaminophen (NORCO/VICODIN) 5-325 MG tablet  05/03/19   [provider]  hydroxychloroquine (PLAQUENIL) 200 MG tablet Take 200 mg by mouth daily.    [provider]  lisinopril (PRINIVIL,ZESTRIL) 10 MG tablet Take 10 mg by mouth daily.      [provider]  metFORMIN (GLUCOPHAGE) 1000 MG tablet Take 1,000 mg by mouth 2 (two) times daily with a meal.    [provider]  orphenadrine (NORFLEX) 100 MG tablet Take 1 tablet (100 mg total) by mouth 2 (two) times daily. 06/24/17   Recardo Evangelist, PA-C  pravastatin (PRAVACHOL) 10 MG tablet Take 10 mg by mouth daily.    [provider]  predniSONE (DELTASONE) 20 MG tablet 2 tabs po daily x 4 days 04/20/17   Charlesetta Shanks, MD  tiZANidine (ZANAFLEX) 4 MG capsule Take 4 mg by mouth 3 (three) times daily.    [provider]  traMADol (ULTRAM) 50 MG tablet Take 1 tablet (50 mg total) by mouth every 6  (six) hours as needed. 06/24/17   Recardo Evangelist, PA-C  traZODone (DESYREL) 50 MG tablet Take 50 mg by mouth at bedtime.    [provider]    Family History No family history on file.  Social History Social History   Tobacco Use   Smoking status: Current Every Day Smoker    Packs/day: 0.50    Types: Cigarettes   Smokeless tobacco: Never Used  Substance Use Topics   Alcohol use: Yes    Comment: occ   Drug use: No     Allergies   Penicillins   Review of Systems Review of Systems  Constitutional: Negative for chills and fever.  HENT: Negative for congestion and facial swelling.   Eyes: Negative for discharge and visual disturbance.  Respiratory: Negative for shortness of breath.   Cardiovascular: Negative for chest pain and palpitations.  Gastrointestinal: Negative for abdominal pain, bowel incontinence, diarrhea and vomiting.  Genitourinary: Negative for bladder incontinence and pelvic pain.  Musculoskeletal: Positive for back pain. Negative for arthralgias and myalgias.  Skin: Negative for color change and rash.  Neurological: Negative for tremors, syncope, numbness, headaches and paresthesias.  Psychiatric/Behavioral: Negative for confusion and dysphoric mood.     Physical Exam Updated Vital Signs BP (!) 142/67 (BP Location: Right Arm)    Pulse (!) 108    Temp 99.1 F (37.3 C) (Oral)    Resp 16    Ht 5\' 10"  (1.778 m)    Wt 117 kg    SpO2 95%    BMI 37.02 kg/m   Physical Exam Vitals signs and nursing note reviewed.  Constitutional:      Appearance: He is well-developed.  HENT:     Head: Normocephalic and atraumatic.  Eyes:     Pupils: Pupils are equal, round, and reactive to light.  Neck:     Musculoskeletal: Normal range of motion and neck supple.     Vascular: No JVD.  Cardiovascular:     Rate and Rhythm: Normal rate and regular rhythm.     Heart sounds: No murmur. No friction rub. No gallop.   Pulmonary:     Effort: No respiratory  distress.     Breath sounds: No wheezing.  Abdominal:     General: There is no distension.     Tenderness: There is no guarding or rebound.  Musculoskeletal: Normal range of motion.     Comments: I am unable to reproduce the patient's pain with palpation of the low back or midline spine.  Pulse motor and sensation are intact to bilateral lower extremities.  Reflexes are 2+ and equal.  There is no clonus.  Skin:  Coloration: Skin is not pale.     Findings: No rash.  Neurological:     Mental Status: He is alert and oriented to person, place, and time.  Psychiatric:        Behavior: Behavior normal.      ED Treatments / Results  Labs (all labs ordered are listed, but only abnormal results are displayed) Labs Reviewed - No data to display  EKG None  Radiology No results found.  Procedures Procedures (including critical care time)  Medications Ordered in ED Medications  acetaminophen (TYLENOL) tablet 1,000 mg (1,000 mg Oral Given 05/04/19 2151)  ketorolac (TORADOL) 15 MG/ML injection 15 mg (15 mg Intramuscular Given 05/04/19 2152)  oxyCODONE (Oxy IR/ROXICODONE) immediate release tablet 5 mg (5 mg Oral Given 05/04/19 2152)  diazepam (VALIUM) tablet 5 mg (5 mg Oral Given 05/04/19 2151)     Initial Impression / Assessment and Plan / ED Course  I have reviewed the triage vital signs and the nursing notes.  Pertinent labs & imaging results that were available during my care of the patient were reviewed by me and considered in my medical decision making (see chart for details).        58 yo M with a significant past medical history of chronic low back pain comes in with a chief complaint of worsening of his chronic pain.  Patient has no red flags.  He is well-appearing and nontoxic.  Exam is without obvious deficit or reflex abnormality.  It seems the patient is upset with his pain management despite his current physicians.  Discussed policy in the ED that we would not be  prescribed pain medicine for chronic pain.  We will give him a dose of pain medicine here.  He is asking to be referred back to [facility], I told him I would put his name and information in the chart and he could call and see if they want to see him back in the office.  9:53 PM:  I have discussed the diagnosis/risks/treatment options with the patient and believe the pt to be eligible for discharge home to follow-up with PCP, ortho spine. We also discussed returning to the ED immediately if new or worsening sx occur. We discussed the sx which are most concerning (e.g., sudden worsening pain, fever, inability to tolerate by mouth, cauda equina s/sx) that necessitate immediate return. Medications administered to the patient during their visit and any new prescriptions provided to the patient are listed below.  Medications given during this visit Medications  acetaminophen (TYLENOL) tablet 1,000 mg (1,000 mg Oral Given 05/04/19 2151)  ketorolac (TORADOL) 15 MG/ML injection 15 mg (15 mg Intramuscular Given 05/04/19 2152)  oxyCODONE (Oxy IR/ROXICODONE) immediate release tablet 5 mg (5 mg Oral Given 05/04/19 2152)  diazepam (VALIUM) tablet 5 mg (5 mg Oral Given 05/04/19 2151)     The patient appears reasonably screen and/or stabilized for discharge and I doubt any other medical condition or other [facility] requiring further screening, evaluation, or treatment in the ED at this time prior to discharge.    Final Clinical Impressions(s) / ED Diagnoses   Final diagnoses:  Sciatica of left side    ED Discharge Orders    None       Melene PlanFloyd, Tonee Silverstein, DO 05/04/19 2153

## 2019-05-04 NOTE — ED Notes (Signed)
Pt states he doesn't take anything for pain, pt c/o vicodin that he got yesterday doesn't work for his back pain and PMD today didnt given him any new meds

## 2019-05-04 NOTE — ED Notes (Signed)
ED Provider at bedside. 

## 2019-05-04 NOTE — ED Notes (Signed)
Pt amb in hallway.

## 2019-05-04 NOTE — Discharge Instructions (Signed)

## 2019-07-14 ENCOUNTER — Other Ambulatory Visit: Payer: Self-pay | Admitting: Orthopedic Surgery

## 2019-07-14 DIAGNOSIS — Z981 Arthrodesis status: Secondary | ICD-10-CM

## 2019-07-22 ENCOUNTER — Ambulatory Visit
Admission: RE | Admit: 2019-07-22 | Discharge: 2019-07-22 | Disposition: A | Discharge status: Home / Self Care | Payer: Medicare Other | Source: Ambulatory Visit | Attending: Orthopedic Surgery | Admitting: Orthopedic Surgery

## 2019-07-22 ENCOUNTER — Encounter (INDEPENDENT_AMBULATORY_CARE_PROVIDER_SITE_OTHER): Payer: Self-pay | Admitting: Otolaryngology

## 2019-07-22 ENCOUNTER — Ambulatory Visit (INDEPENDENT_AMBULATORY_CARE_PROVIDER_SITE_OTHER): Payer: Medicare Other | Admitting: Otolaryngology

## 2019-07-22 ENCOUNTER — Other Ambulatory Visit: Payer: Self-pay

## 2019-07-22 VITALS — Temp 97.5°F

## 2019-07-22 DIAGNOSIS — Z981 Arthrodesis status: Secondary | ICD-10-CM | POA: Diagnosis not present

## 2019-07-22 NOTE — Progress Notes (Signed)
HPI: Brett Wallace is a 58 y.o. male who presents is referred by Melina Schools, MD for evaluation of vocal cords prior to anterior cervical fusion surgery.  Patient has had 2 previous anterior cervical approaches on the left side.  He is presently getting ready to schedule anterior cervical approach through the right side if possible.  On the previous surgeries he had no real problems with swallowing or hoarseness. Patient does smoke about up third of a pack a day and is trying to stop.  He has no hoarseness presently...  Past Medical History:  Diagnosis Date  . Blind left eye   . Diabetes mellitus without complication ([facility])   . Hypertension   . Optic neuritis, left    Past Surgical History:  Procedure Laterality Date  . APPENDECTOMY    . CARPAL TUNNEL RELEASE    . ELBOW SURGERY    . EYE SURGERY    . neck sugery    . WRIST SURGERY     Social History   Socioeconomic History  . Marital status: Single    Spouse name: Not on file  . Number of children: Not on file  . Years of education: Not on file  . Highest education level: Not on file  Occupational History  . Not on file  Tobacco Use  . Smoking status: Current Every Day Smoker    Packs/day: 0.50    Years: 30.00    Pack years: 15.00    Types: Cigarettes    Start date: 69  . Smokeless tobacco: Never Used  Substance and Sexual Activity  . Alcohol use: Yes    Comment: occ  . Drug use: No  . Sexual activity: Not on file  Other Topics Concern  . Not on file  Social History Narrative  . Not on file   Social Determinants of Health   Financial Resource Strain:   . Difficulty of Paying Living Expenses: Not on file  Food Insecurity:   . Worried About Charity fundraiser in the Last Year: Not on file  . Ran Out of Food in the Last Year: Not on file  Transportation Needs:   . Lack of Transportation (Medical): Not on file  . Lack of Transportation (Non-Medical): Not on file  Physical Activity:   . Days of Exercise per  Week: Not on file  . Minutes of Exercise per Session: Not on file  Stress:   . Feeling of Stress : Not on file  Social Connections:   . Frequency of Communication with Friends and Family: Not on file  . Frequency of Social Gatherings with Friends and Family: Not on file  . Attends Religious Services: Not on file  . Active Member of Clubs or Organizations: Not on file  . Attends Archivist Meetings: Not on file  . Marital Status: Not on file   No family history on file. Allergies  Allergen Reactions  . Penicillins Rash   Prior to Admission medications   Medication Sig Start Date End Date Taking? Authorizing Provider  allopurinol (ZYLOPRIM) 100 MG tablet Take 100 mg by mouth daily.   Yes [provider]  carvedilol (COREG) 25 MG tablet Take 25 mg by mouth 2 (two) times daily with a meal.   Yes [provider]  cyclobenzaprine (FLEXERIL) 10 MG tablet Take 10 mg by mouth 3 (three) times daily as needed for muscle spasms.   Yes [provider]  furosemide (LASIX) 20 MG tablet  05/04/19  Yes [provider]  gabapentin (NEURONTIN) 300 MG capsule Take 300 mg by mouth 3 (three) times daily.   Yes [provider]  glipiZIDE (GLUCOTROL) 5 MG tablet Take 10 mg by mouth daily before breakfast.    Yes [provider]  hydrochlorothiazide (HYDRODIURIL) 25 MG tablet Take 25 mg by mouth daily.   Yes [provider]  HYDROcodone-acetaminophen (NORCO/VICODIN) 5-325 MG tablet  05/03/19  Yes [provider]  hydroxychloroquine (PLAQUENIL) 200 MG tablet Take 200 mg by mouth daily.   Yes [provider]  lisinopril (PRINIVIL,ZESTRIL) 10 MG tablet Take 10 mg by mouth daily.     Yes [provider]  metFORMIN (GLUCOPHAGE) 1000 MG tablet Take 1,000 mg by mouth 2 (two) times daily with a meal.   Yes [provider]  orphenadrine (NORFLEX) 100 MG tablet Take 1 tablet (100 mg total) by mouth 2 (two) times  daily. 06/24/17  Yes Bethel Born, PA-C  pravastatin (PRAVACHOL) 10 MG tablet Take 10 mg by mouth daily.   Yes [provider]  predniSONE (DELTASONE) 20 MG tablet 2 tabs po daily x 4 days 04/20/17  Yes Pfeiffer, Lebron Conners, MD  tiZANidine (ZANAFLEX) 4 MG capsule Take 4 mg by mouth 3 (three) times daily.   Yes [provider]  traMADol (ULTRAM) 50 MG tablet Take 1 tablet (50 mg total) by mouth every 6 (six) hours as needed. 06/24/17  Yes Bethel Born, PA-C  traZODone (DESYREL) 50 MG tablet Take 50 mg by mouth at bedtime.   Yes [provider]     Positive ROS: Otherwise -5  All other systems have been reviewed and were otherwise negative with the exception of those mentioned in the HPI and as above.  Physical Exam: Constitutional: Alert, well-appearing, no acute distress Ears: External ears without lesions or tenderness. Ear canals are clear bilaterally with intact, clear TMs.  Nasal: External nose without lesions. Septum with mild deformity.. Clear nasal passages Nasal endoscopy was performed through the left side.  Nasopharynx was clear.  Base of tongue and epiglottis were normal.  Vocal cords were clear bilaterally with normal vocal cord mobility bilaterally. Oral: Lips and gums without lesions. Tongue and palate mucosa without lesions. Posterior oropharynx clear. Neck: No palpable adenopathy or masses well-healed left neck incisions. Respiratory: Breathing comfortably  Skin: No facial/neck lesions or rash noted.  Laryngoscopy  Date/Time: 07/22/2019 5:43 PM Performed by: Drema Halon, MD Authorized by: Drema Halon, MD   Consent:    Consent obtained:  Verbal   Consent given by:  Patient   Risks discussed:  Pain Procedure details:    Indications: hoarseness, dysphagia, or aspiration     Medication:  Afrin   Instrument: flexible fiberoptic laryngoscope     Scope location: left nare   Mouth:    Oropharynx: normal     Vallecula:  normal     Base of tongue: normal     Epiglottis: normal   Throat:    True vocal cords: normal   Comments:     Both vocal cords with normal symmetric mobility.    Assessment: Normal vocal cord mobility bilaterally  Plan: Patient should be able to have anterior cervical approach through either side as he has normal vocal cord function bilaterally.   Narda Bonds, MD   CC:

## 2019-09-22 ENCOUNTER — Ambulatory Visit: Payer: Self-pay | Admitting: Orthopedic Surgery

## 2019-09-27 ENCOUNTER — Ambulatory Visit: Payer: Self-pay | Admitting: Orthopedic Surgery

## 2019-09-27 NOTE — H&P (Deleted)
  The note originally documented on this encounter has been moved the the encounter in which it belongs.  

## 2019-09-27 NOTE — H&P (Signed)
Subjective:   Brett Wallace is a pleasant 59 year old male past medical history significant for two previous left-sided approach ACDF's, diabetes (A most recent A1c was 7.5), and tobacco use disorder (currently using nicotine lozenges) who has had neck pain and bilateral radicular arm pain left side greater than right for some time now. Despite over-the-counter medications, narcotic medications (currently on hydrocodone 7.5), and injection therapy the patient continues to have severe, debilitating pain and he would like to move forward with surgical intervention. He is scheduled for ACDF C3-4 at [facility] on 10/07/19 With Dr. Shon Baton.  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 Blind left eye   . Diabetes mellitus without complication ([facility])   . Hypertension   . Optic neuritis, left     Past Surgical History:  Procedure Laterality Date  . APPENDECTOMY    . CARPAL TUNNEL RELEASE    . ELBOW SURGERY    . EYE SURGERY    . neck sugery    . WRIST SURGERY      Current Outpatient Medications  Medication Sig Dispense Refill Last Dose  . allopurinol (ZYLOPRIM) 100 MG tablet Take 100 mg by mouth daily.     . carvedilol (COREG) 25 MG tablet Take 25 mg by mouth 2 (two) times daily with a meal.     . cyclobenzaprine (FLEXERIL) 10 MG tablet Take 10 mg by mouth 3 (three) times daily as needed for muscle spasms.     . furosemide (LASIX) 20 MG tablet      . gabapentin (NEURONTIN) 300 MG capsule Take 300 mg by mouth 3 (three) times daily.     05/20/2019 glipiZIDE (GLUCOTROL) 5 MG tablet Take 10 mg by mouth daily before breakfast.      . hydrochlorothiazide (HYDRODIURIL) 25 MG tablet Take 25 mg by mouth daily.     Marland Kitchen  HYDROcodone-acetaminophen (NORCO/VICODIN) 5-325 MG tablet      . hydroxychloroquine (PLAQUENIL) 200 MG tablet Take 200 mg by mouth daily.     Marland Kitchen lisinopril (PRINIVIL,ZESTRIL) 10 MG tablet Take 10 mg by mouth daily.       . metFORMIN (GLUCOPHAGE) 1000 MG tablet Take 1,000 mg by mouth 2 (two) times daily with a meal.     . orphenadrine (NORFLEX) 100 MG tablet Take 1 tablet (100 mg total) by mouth 2 (two) times daily. 30 tablet 0   . pravastatin (PRAVACHOL) 10 MG tablet Take 10 mg by mouth daily.     . predniSONE (DELTASONE) 20 MG tablet 2 tabs po daily x 4 days 8 tablet 0   . tiZANidine (ZANAFLEX) 4 MG capsule Take 4 mg by mouth 3 (three) times daily.     . traMADol (ULTRAM) 50 MG tablet Take 1 tablet (50 mg total) by mouth every 6 (six) hours as needed. 15 tablet 0   . traZODone (DESYREL) 50 MG tablet Take 50 mg by mouth at bedtime.      No current facility-administered medications for this visit.   Allergies  Allergen Reactions  . Penicillins Rash    Social History   Tobacco Use  . Smoking status: Current Every Day Smoker    Packs/day: 0.50    Years: 30.00    Pack years: 15.00    Types: Cigarettes  Start date: 43  . Smokeless tobacco: Never Used  Substance Use Topics  . Alcohol use: Yes    Comment: occ    Reviewed Family History Mother - Arthritis - pt. added directly (05/17/19) Maternal Grandmother - Arthritis - pt. added directly (05/17/19)  Review of Systems As stated in HPI  Objective:   Vitals: Ht: 5 ft 9.5 in 09/27/2019 03:24 pm Wt: 264 lbs 09/27/2019 03:27 pm BMI: 38.4 09/27/2019 03:27 pm BP: 149/88 sitting L arm 09/27/2019 03:28 pm  Clinical exam: Lorraine is a pleasant individual, who appears younger than their stated age. He Is alert and orientated 3.  Heart: Regular rate and rhythm, no rubs, murmurs, or gallops  Lungs: Clear auscultation bilaterally  Abdomen is soft and non-tender, negative loss of bowel and bladder control, no rebound tenderness.  Bowel sounds 4Negative: skin lesions abrasions contusions  Peripheral pulses: 2+ dorsalis pedis/posterior tibialis/radial artery pulses bilaterally. Compartment soft and nontender.  Gait pattern: Altered gait pattern due to severe low back pain radiating into the buttock and legs.  Assistive devices: Cane  Neuro: He continues to have significant pain primarily in the left lateral aspect of the neck and trapezius. Occasionally he gets dysesthesias into the upper extremities bilaterally. Negative Hoffman test, negative Babinski test, normal gait pattern. Symmetrical 1+ deep tendon reflexes in the upper extremity. 5/5 motor strength bilaterally in the upper extremity. Patient complains of significant neck pain and occipital headaches.  Musculoskeletal: Patient has pain with palpation and range of motion of the cervical spine. Significant complaints of intermittent upper extremity pain with range of motion.  X-rays of the cervical spine demonstrate a solid ACDF at C4/5 and C6-7. No hardware complications. Mild degenerative changes at the intervening C5-6 level. Slight degeneration at the C3-4 level.  MRI of the cervical spine dated 09/18/18: Moderate left paracentral disc protrusion at C3-4. There is cord flattening but no cord signal changes. Moderate foraminal stenosis. No complicating features at the ACDF levels of C4/5 or C6-7. Small central disc protrusion at C5-6 with mild foraminal narrowing.  CT scan: completed on 07/22/19 was reviewed with the patient. It was completed at [facility] I have independently reviewed the images as well as the radiology report. Solid fusion C4-5 and C6-7. No significant complicate features with the intervertebral cages and the fusion. He still has mild central stenosis at C4-5 with moderate foraminal narrowing. No significant stenosis at C6-7. Again the large broad-based left paracentral disc protrusion at C3-4 is seen. Marked bilateral neural foraminal  narrowing is noted.  Assessment:    Chaze returns today for follow-up of his CT scan. Similar to his MRI he has disease primarily at the C3-4 level. He has a solid fusion at the surgical levels. While there is some residual stenosis at C4-5 he is not having significant C5 radicular pain. Furthermore the patient had significant temporary improvement with a C4 selective nerve root block. At this point time based on his imaging studies and his clinical history I believe the primary source of pathology is the C3-4 level. Fortunately he does not demonstrate any signs of myelopathy. I have gone over the surgical procedure with him which would be a revision ACDF at C3-4. However this would be his third surgical procedure on his neck. Even though he is been cleared for an approach from either side based on normal focal cord function he still has a much higher likelihood of developing dysphasia or dysphonia postoperatively. Given the fact that this is a high cervical approach  in the setting of previous cervical surgery the likelihood is higher of these potential complications. I have also gone over the other risks and benefits in great detail. Risks and benefits of surgery were discussed with the patient. These include: Infection, bleeding, death, stroke, paralysis, ongoing or worse pain, need for additional surgery, nonunion, leak of spinal fluid, adjacent segment degeneration requiring additional fusion surgery. Pseudoarthrosis (nonunion)requiring supplemental posterior fixation. Throat pain, swallowing difficulties, hoarseness or change in voice  Plan:   C3-4 ACDF from a right anterior approach Pending preoperative labs.  We will obtain preoperative clearance from the patient's primary care provider he states that the patient is cleared for surgery, although they do recommend he stop tobacco use. Patient is currently using nicotine lozenges rather than smoking. I did advise him that while I prefer him using the  loss of disc to smoking, any nicotine use at all will decrease his risk of fusion and increase his postoperative risk. Patient did express understanding of this.  Patient has also been seen and evaluated by ENT who states the anterior approach from either the left or right is appropriate and safe.  I did review the patient's medication list with him. He is not on any blood thinners or anti-inflammatory medications. I have advised him not to take any anti-inflammatory medications leading up to surgery. He has getting hydrocodone 7.5/acetaminophen 325 from another provider.  We have also discussed the post-operative recovery period to include: bathing/showering restrictions, wound healing, activity (and driving) restrictions, medications/pain mangement.  We have also discussed post-operative redflags to include: signs and symptoms of postoperative infection, DVT/PE.  Patient has been supplied an Biochemist, clinical.  All questions were invited and answered  Follow-up: 2 weeks postoperatively

## 2019-10-04 NOTE — Progress Notes (Addendum)
[facility] [address] [place], [place] - [address] [place] [address] Phone: [phone] Fax: [phone]    Your procedure is scheduled on Thursday, March 18th.  Report to [facility] Main Entrance "A" at 5:30 A.M., and check in at the Admitting office.  Call this number if you have problems the morning of surgery:  [phone]  Call [phone] if you have any questions prior to your surgery date Monday-Friday 8am-4pm   Remember:  Do not eat or drink after midnight the night before your surgery   Take these medicines the morning of surgery with A SIP OF WATER  allopurinol (ZYLOPRIM)  amLODipine (NORVASC)  HYDROcodone-acetaminophen (NORCO)  pravastatin (PRAVACHOL)   Follow your surgeon's instructions on when to stop Aspirin.  If no instructions were given by your surgeon then you will need to call the office to get those instructions.    As of today, STOP taking Aleve, Naproxen, Ibuprofen, Motrin, Advil, Goody's, BC's, all herbal medications, fish oil, and all vitamins.    WHAT DO I DO ABOUT MY DIABETES MEDICATION?  Marland Kitchen Do not take glipiZIDE (GLUCOTROL)/oral diabetes medicines (pills) the morning of surgery.  HOW TO MANAGE YOUR DIABETES BEFORE AND AFTER SURGERY  Why is it important to control my blood sugar before and after surgery? . Improving blood sugar levels before and after surgery helps healing and can limit problems. . A way of improving blood sugar control is eating a healthy diet by: o  Eating less sugar and carbohydrates o  Increasing activity/exercise o  Talking with your doctor about reaching your blood sugar goals . High blood sugars (greater than 180 mg/dL) can raise your risk of infections and slow your recovery, so you will need to focus on controlling your diabetes during the weeks before surgery. . Make sure that the doctor who takes care of your diabetes knows about your planned surgery including the date and  location.  How do I manage my blood sugar before surgery? . Check your blood sugar at least 4 times a day, starting 2 days before surgery, to make sure that the level is not too high or low. . Check your blood sugar the morning of your surgery when you wake up and every 2 hours until you get to the Short Stay unit. o If your blood sugar is less than 70 mg/dL, you will need to treat for low blood sugar: - Do not take insulin. - Treat a low blood sugar (less than 70 mg/dL) with  cup of clear juice (cranberry or apple), 4 glucose tablets, OR glucose gel. - Recheck blood sugar in 15 minutes after treatment (to make sure it is greater than 70 mg/dL). If your blood sugar is not greater than 70 mg/dL on recheck, call [phone] for further instructions. . Report your blood sugar to the short stay nurse when you get to Short Stay.  . If you are admitted to the hospital after surgery: o Your blood sugar will be checked by the staff and you will probably be given insulin after surgery (instead of oral diabetes medicines) to make sure you have good blood sugar levels. o The goal for blood sugar control after surgery is 80-180 mg/dL.  The Morning of Surgery  Do not wear jewelry.  Do not wear lotions, powders, colognes, or deodorant  Men may shave face and neck.  Do not bring valuables to the hospital.  [facility] is not responsible for any belongings  or valuables.  If you are a smoker, DO NOT Smoke 24 hours prior to surgery  If you wear a CPAP at night please bring your mask the morning of surgery   Remember that you must have someone to transport you home after your surgery, and remain with you for 24 hours if you are discharged the same day.  Please bring cases for contacts, glasses, hearing aids, dentures or bridgework because it cannot be worn into surgery.   Leave your suitcase in the car.  After surgery it may be brought to your room.  For patients admitted to the hospital, discharge  time will be determined by your treatment team.  Patients discharged the day of surgery will not be allowed to drive home.   Special instructions:   [facility]- Preparing For Surgery  Before surgery, you can play an important role. Because skin is not sterile, your skin needs to be as free of germs as possible. You can reduce the number of germs on your skin by washing with CHG (chlorahexidine gluconate) Soap before surgery.  CHG is an antiseptic cleaner which kills germs and bonds with the skin to continue killing germs even after washing.    Oral Hygiene is also important to reduce your risk of infection.  Remember - BRUSH YOUR TEETH THE MORNING OF SURGERY WITH YOUR REGULAR TOOTHPASTE  Please do not use if you have an allergy to CHG or antibacterial soaps. If your skin becomes reddened/irritated stop using the CHG.  Do not shave (including legs and underarms) for at least 48 hours prior to first CHG shower. It is OK to shave your face.  Please follow these instructions carefully.   1. Shower the NIGHT BEFORE SURGERY and the MORNING OF SURGERY with CHG Soap.   2. If you chose to wash your hair, wash your hair first as usual with your normal shampoo.  3. After you shampoo, rinse your hair and body thoroughly to remove the shampoo.  4. Use CHG as you would any other liquid soap. You can apply CHG directly to the skin and wash gently with a scrungie or a clean washcloth.   5. Apply the CHG Soap to your body ONLY FROM THE NECK DOWN.  Do not use on open wounds or open sores. Avoid contact with your eyes, ears, mouth and genitals (private parts). Wash Face and genitals (private parts)  with your normal soap.   6. Wash thoroughly, paying special attention to the area where your surgery will be performed.  7. Thoroughly rinse your body with warm water from the neck down.  8. DO NOT shower/wash with your normal soap after using and rinsing off the CHG Soap.  9. Pat yourself dry with a CLEAN  TOWEL.  10. Wear CLEAN PAJAMAS to bed the night before surgery, wear comfortable clothes the morning of surgery  11. Place CLEAN SHEETS on your bed the night of your first shower and DO NOT SLEEP WITH PETS.  Day of Surgery: Please shower the morning of surgery with the CHG soap Do not apply any deodorants/lotions. Please wear clean clothes to the hospital/surgery center.   Remember to brush your teeth WITH YOUR REGULAR TOOTHPASTE.  Please read over the following fact sheets that you were given.

## 2019-10-05 ENCOUNTER — Other Ambulatory Visit: Payer: Self-pay

## 2019-10-05 ENCOUNTER — Ambulatory Visit (HOSPITAL_COMMUNITY)
Admission: RE | Admit: 2019-10-05 | Discharge: 2019-10-05 | Disposition: A | Discharge status: Home / Self Care | Payer: Medicare Other | Source: Ambulatory Visit | Attending: Orthopedic Surgery | Admitting: Orthopedic Surgery

## 2019-10-05 ENCOUNTER — Encounter (HOSPITAL_COMMUNITY): Payer: Self-pay

## 2019-10-05 ENCOUNTER — Encounter (HOSPITAL_COMMUNITY)
Admission: RE | Admit: 2019-10-05 | Discharge: 2019-10-05 | Disposition: A | Discharge status: Home / Self Care | Payer: Medicare Other | Source: Ambulatory Visit | Attending: Orthopedic Surgery | Admitting: Orthopedic Surgery

## 2019-10-05 ENCOUNTER — Other Ambulatory Visit (HOSPITAL_COMMUNITY)
Admission: RE | Admit: 2019-10-05 | Discharge: 2019-10-05 | Disposition: A | Discharge status: Home / Self Care | Payer: Medicare Other | Source: Ambulatory Visit | Attending: Orthopedic Surgery | Admitting: Orthopedic Surgery

## 2019-10-05 DIAGNOSIS — Z01812 Encounter for preprocedural laboratory examination: Secondary | ICD-10-CM | POA: Insufficient documentation

## 2019-10-05 DIAGNOSIS — M5030 Other cervical disc degeneration, unspecified cervical region: Secondary | ICD-10-CM | POA: Diagnosis present

## 2019-10-05 DIAGNOSIS — Z7982 Long term (current) use of aspirin: Secondary | ICD-10-CM | POA: Diagnosis not present

## 2019-10-05 DIAGNOSIS — M1990 Unspecified osteoarthritis, unspecified site: Secondary | ICD-10-CM | POA: Diagnosis not present

## 2019-10-05 DIAGNOSIS — E7800 Pure hypercholesterolemia, unspecified: Secondary | ICD-10-CM | POA: Diagnosis not present

## 2019-10-05 DIAGNOSIS — Z6836 Body mass index (BMI) 36.0-36.9, adult: Secondary | ICD-10-CM | POA: Diagnosis not present

## 2019-10-05 DIAGNOSIS — Z794 Long term (current) use of insulin: Secondary | ICD-10-CM | POA: Insufficient documentation

## 2019-10-05 DIAGNOSIS — E669 Obesity, unspecified: Secondary | ICD-10-CM | POA: Diagnosis not present

## 2019-10-05 DIAGNOSIS — E119 Type 2 diabetes mellitus without complications: Secondary | ICD-10-CM | POA: Insufficient documentation

## 2019-10-05 DIAGNOSIS — Z01818 Encounter for other preprocedural examination: Secondary | ICD-10-CM | POA: Diagnosis not present

## 2019-10-05 DIAGNOSIS — M79602 Pain in left arm: Secondary | ICD-10-CM | POA: Diagnosis not present

## 2019-10-05 DIAGNOSIS — Z981 Arthrodesis status: Secondary | ICD-10-CM | POA: Diagnosis not present

## 2019-10-05 DIAGNOSIS — Z20822 Contact with and (suspected) exposure to covid-19: Secondary | ICD-10-CM | POA: Diagnosis not present

## 2019-10-05 DIAGNOSIS — M4802 Spinal stenosis, cervical region: Secondary | ICD-10-CM | POA: Diagnosis not present

## 2019-10-05 DIAGNOSIS — Z79899 Other long term (current) drug therapy: Secondary | ICD-10-CM | POA: Diagnosis not present

## 2019-10-05 DIAGNOSIS — M5011 Cervical disc disorder with radiculopathy,  high cervical region: Secondary | ICD-10-CM | POA: Diagnosis not present

## 2019-10-05 DIAGNOSIS — I10 Essential (primary) hypertension: Secondary | ICD-10-CM | POA: Diagnosis not present

## 2019-10-05 DIAGNOSIS — Z880 Allergy status to penicillin: Secondary | ICD-10-CM | POA: Diagnosis not present

## 2019-10-05 DIAGNOSIS — F17210 Nicotine dependence, cigarettes, uncomplicated: Secondary | ICD-10-CM | POA: Diagnosis not present

## 2019-10-05 DIAGNOSIS — M109 Gout, unspecified: Secondary | ICD-10-CM | POA: Diagnosis not present

## 2019-10-05 DIAGNOSIS — Z7952 Long term (current) use of systemic steroids: Secondary | ICD-10-CM | POA: Diagnosis not present

## 2019-10-05 DIAGNOSIS — H5462 Unqualified visual loss, left eye, normal vision right eye: Secondary | ICD-10-CM | POA: Diagnosis not present

## 2019-10-05 HISTORY — DX: Unspecified osteoarthritis, unspecified site: M19.90

## 2019-10-05 LAB — BASIC METABOLIC PANEL
Anion gap: 12 (ref 5–15)
BUN: 13 mg/dL (ref 6–20)
CO2: 26 mmol/L (ref 22–32)
Calcium: 9.2 mg/dL (ref 8.9–10.3)
Chloride: 102 mmol/L (ref 98–111)
Creatinine, Ser: 1.35 mg/dL — ABNORMAL HIGH (ref 0.61–1.24)
GFR calc Af Amer: >60 mL/min (ref >60)
GFR calc non Af Amer: 58 mL/min — ABNORMAL LOW (ref >60)
Glucose, Bld: 172 mg/dL — ABNORMAL HIGH (ref 70–99)
Potassium: 3.8 mmol/L (ref 3.5–5.1)
Sodium: 140 mmol/L (ref 135–145)

## 2019-10-05 LAB — PROTIME-INR
INR: 1.1 (ref 0.8–1.2)
Prothrombin Time: 13.8 seconds (ref 11.4–15.2)

## 2019-10-05 LAB — CBC
HCT: 45.1 % (ref 39.0–52.0)
Hemoglobin: 14.4 g/dL (ref 13.0–17.0)
MCH: 30.5 pg (ref 26.0–34.0)
MCHC: 31.9 g/dL (ref 30.0–36.0)
MCV: 95.6 fL (ref 80.0–100.0)
Platelets: 211 10*3/uL (ref 150–400)
RBC: 4.72 MIL/uL (ref 4.22–5.81)
RDW: 11.9 % (ref 11.5–15.5)
WBC: 8.6 10*3/uL (ref 4.0–10.5)
nRBC: 0 % (ref 0.0–0.2)

## 2019-10-05 LAB — SARS CORONAVIRUS 2 (TAT 6-24 HRS): SARS Coronavirus 2: NEGATIVE

## 2019-10-05 LAB — GLUCOSE, CAPILLARY: Glucose-Capillary: 191 mg/dL — ABNORMAL HIGH (ref 70–99)

## 2019-10-05 LAB — APTT: aPTT: 34 seconds (ref 24–36)

## 2019-10-05 LAB — SURGICAL PCR SCREEN
MRSA, PCR: NEGATIVE
Staphylococcus aureus: NEGATIVE

## 2019-10-05 NOTE — Progress Notes (Signed)
   10/05/19 1117  OBSTRUCTIVE SLEEP APNEA  Have you ever been diagnosed with sleep apnea through a sleep study? No  Do you snore loudly (loud enough to be heard through closed doors)?  0  Do you often feel tired, fatigued, or sleepy during the daytime (such as falling asleep during driving or talking to someone)? 0  Has anyone observed you stop breathing during your sleep? 0  Do you have, or are you being treated for high blood pressure? 1  BMI more than 35 kg/m2? 1  Age > 50 (1-yes) 1  Neck circumference greater than:Male 16 inches or larger, Male 17inches or larger? 1  Male Gender (Yes=1) 1  Obstructive Sleep Apnea Score 5  Score 5 or greater  Results sent to PCP

## 2019-10-05 NOTE — Progress Notes (Signed)
PCP - Dr. Ardyth Gal  Chest x-ray - today EKG - requested from [facility] 11/19/18 Stress Test - denies ECHO - denies Cardiac Cath - denies  Sleep Study - +StopBang tool, sent to pt's PCP, Dr. Haze Rushing  CBG today 191 (pt has eaten today) Checks Blood Sugar ___2__ times a day A1C was 7.5 a month ago per pt.  Aspirin Instructions: last dose was 09/29/19  COVID TEST- will be done today  Visitation policy reviewed with pt and he voiced understanding.   Anesthesia review: yes, MD requested, requested EKG tracing from [facility]  Patient denies shortness of breath, fever, cough and chest pain at PAT appointment   All instructions explained to the patient, with a verbal understanding of the material. Patient agrees to go over the instructions while at home for a better understanding. Patient also instructed to self quarantine after being tested for COVID-19. The opportunity to ask questions was provided.

## 2019-10-06 LAB — HEMOGLOBIN A1C
Hgb A1c MFr Bld: 7.4 % — ABNORMAL HIGH (ref 4.8–5.6)
Mean Plasma Glucose: 165.68 mg/dL

## 2019-10-06 MED ORDER — VANCOMYCIN HCL 1500 MG/300ML IV SOLN
1500.0000 mg | INTRAVENOUS | Status: AC
  Administered 2019-10-07: 1500 mg via INTRAVENOUS
  Filled 2019-10-06: qty 300

## 2019-10-06 NOTE — Anesthesia Preprocedure Evaluation (Addendum)
Anesthesia Evaluation  Patient identified by MRN, date of birth, ID band Patient awake    Reviewed: Allergy & Precautions, NPO status , Patient's Chart, lab work & pertinent test results  Airway Mallampati: II  TM Distance: >3 FB Neck ROM: Limited    Dental no notable dental hx. (+) Edentulous Upper, Partial Lower, Dental Advisory Given   Pulmonary Patient abstained from smoking., former smoker,    Pulmonary exam normal breath sounds clear to auscultation       Cardiovascular hypertension, Pt. on medications Normal cardiovascular exam Rhythm:Regular Rate:Normal     Neuro/Psych negative neurological ROS  negative psych ROS   GI/Hepatic   Endo/Other  diabetes, Type 1, Insulin Dependent, Oral Hypoglycemic Agents  Renal/GU K+ 3.8 Cr 1.35     Musculoskeletal  (+) Arthritis ,   Abdominal (+) + obese,   Peds  Hematology Hgb 14.4 Plt 211   Anesthesia Other Findings   Reproductive/Obstetrics                          Anesthesia Physical Anesthesia Plan  ASA: III  Anesthesia Plan: General   Post-op Pain Management:    Induction: Intravenous  PONV Risk Score and Plan: 3 and Treatment may vary due to age or medical condition, Ondansetron and Dexamethasone  Airway Management Planned: Video Laryngoscope Planned  Additional Equipment: None  Intra-op Plan:   Post-operative Plan: Extubation in OR  Informed Consent: I have reviewed the patients History and Physical, chart, labs and discussed the procedure including the risks, benefits and alternatives for the proposed anesthesia with the patient or authorized representative who has indicated his/her understanding and acceptance.     Dental advisory given  Plan Discussed with:   Anesthesia Plan Comments: (Pt seen by PCP Dr. Haze Rushing 08/13/19 for preop clearance. Per note, "Pre-op : Patient is medically cleared to undergo Revision ACDF surgery.He  is advised to quit tobacco prior to surgery."   IDDMII, last A1c 7.4 on preop labs.  In reviewing pt's PCP notes, pt was reported to be taking Levemir 30units QHS. This medication was not listed in Epic. I called and spoke with the patient to clarify. He states he did report the medication to pharmacy but evidently it did not get entered into Epic. Because it was not entered into Epic, he did not receive preop instructions regarding insulin. I instructed the pt to take half his normal dose, 15units, the night before surgery, per protocol. He verbalized understanding. Med added in epic.   CKDIII: Patient with stable kidney disease. Nephrologist: Dr. Arnette Schaumann.   Preop labs reviewed, creatinine 1.35 c/w CKD. Otherwise unremarkable.  EKG 08/13/19: Dr. Haze Rushing wrote on tracing "NSR. 89bpm. No acute ST-T changes". Copy on chart.)      Anesthesia Quick Evaluation

## 2019-10-06 NOTE — Progress Notes (Signed)
Anesthesia Chart Review:  Pt seen by PCP Dr. Haze Rushing 08/13/19 for preop clearance. Per note, "Pre-op : Patient is medically cleared to undergo Revision ACDF surgery.He is advised to quit tobacco prior to surgery."   IDDMII, last A1c 7.4 on preop labs.  In reviewing pt's PCP notes, pt was reported to be taking Levemir 30units QHS. This medication was not listed in Epic. I called and spoke with the patient to clarify. He states he did report the medication to pharmacy but evidently it did not get entered into Epic. Because it was not entered into Epic, he did not receive preop instructions regarding insulin. I instructed the pt to take half his normal dose, 15units, the night before surgery, per protocol. He verbalized understanding. Med added in epic.   CKDIII: Patient with stable kidney disease. Nephrologist: Dr. Arnette Schaumann.   Preop labs reviewed, creatinine 1.35 c/w CKD. Otherwise unremarkable.  EKG 08/13/19: Dr. Haze Rushing wrote on tracing "NSR. 89bpm. No acute ST-T changes". Copy on chart.  [facility] Short Stay Center/Anesthesiology Phone [phone] 10/06/2019 11:13 AM

## 2019-10-07 ENCOUNTER — Encounter (HOSPITAL_COMMUNITY): Payer: Self-pay | Admitting: Orthopedic Surgery

## 2019-10-07 ENCOUNTER — Observation Stay (HOSPITAL_COMMUNITY)
Admission: AD | Admit: 2019-10-07 | Discharge: 2019-10-08 | Disposition: A | Discharge status: Home / Self Care | Payer: Medicare Other | Attending: Orthopedic Surgery | Admitting: Orthopedic Surgery

## 2019-10-07 ENCOUNTER — Ambulatory Visit (HOSPITAL_COMMUNITY): Payer: Medicare Other | Admitting: Physician Assistant

## 2019-10-07 ENCOUNTER — Ambulatory Visit (HOSPITAL_COMMUNITY): Payer: Medicare Other

## 2019-10-07 ENCOUNTER — Ambulatory Visit (HOSPITAL_COMMUNITY)
Admission: AD | Disposition: A | Discharge status: Home / Self Care | Payer: Self-pay | Source: Home / Self Care | Attending: Orthopedic Surgery

## 2019-10-07 ENCOUNTER — Other Ambulatory Visit: Payer: Self-pay

## 2019-10-07 DIAGNOSIS — Z79899 Other long term (current) drug therapy: Secondary | ICD-10-CM | POA: Insufficient documentation

## 2019-10-07 DIAGNOSIS — M79602 Pain in left arm: Secondary | ICD-10-CM | POA: Insufficient documentation

## 2019-10-07 DIAGNOSIS — M5030 Other cervical disc degeneration, unspecified cervical region: Secondary | ICD-10-CM | POA: Diagnosis present

## 2019-10-07 DIAGNOSIS — M4802 Spinal stenosis, cervical region: Secondary | ICD-10-CM | POA: Insufficient documentation

## 2019-10-07 DIAGNOSIS — Z981 Arthrodesis status: Secondary | ICD-10-CM | POA: Insufficient documentation

## 2019-10-07 DIAGNOSIS — M109 Gout, unspecified: Secondary | ICD-10-CM | POA: Insufficient documentation

## 2019-10-07 DIAGNOSIS — Z7952 Long term (current) use of systemic steroids: Secondary | ICD-10-CM | POA: Insufficient documentation

## 2019-10-07 DIAGNOSIS — M5011 Cervical disc disorder with radiculopathy,  high cervical region: Principal | ICD-10-CM | POA: Insufficient documentation

## 2019-10-07 DIAGNOSIS — E119 Type 2 diabetes mellitus without complications: Secondary | ICD-10-CM | POA: Insufficient documentation

## 2019-10-07 DIAGNOSIS — E7800 Pure hypercholesterolemia, unspecified: Secondary | ICD-10-CM | POA: Diagnosis not present

## 2019-10-07 DIAGNOSIS — E669 Obesity, unspecified: Secondary | ICD-10-CM | POA: Insufficient documentation

## 2019-10-07 DIAGNOSIS — H5462 Unqualified visual loss, left eye, normal vision right eye: Secondary | ICD-10-CM | POA: Insufficient documentation

## 2019-10-07 DIAGNOSIS — F17210 Nicotine dependence, cigarettes, uncomplicated: Secondary | ICD-10-CM | POA: Insufficient documentation

## 2019-10-07 DIAGNOSIS — Z880 Allergy status to penicillin: Secondary | ICD-10-CM | POA: Insufficient documentation

## 2019-10-07 DIAGNOSIS — Z419 Encounter for procedure for purposes other than remedying health state, unspecified: Secondary | ICD-10-CM

## 2019-10-07 DIAGNOSIS — I10 Essential (primary) hypertension: Secondary | ICD-10-CM | POA: Insufficient documentation

## 2019-10-07 DIAGNOSIS — Z6836 Body mass index (BMI) 36.0-36.9, adult: Secondary | ICD-10-CM | POA: Insufficient documentation

## 2019-10-07 DIAGNOSIS — Z01818 Encounter for other preprocedural examination: Secondary | ICD-10-CM | POA: Insufficient documentation

## 2019-10-07 DIAGNOSIS — M1990 Unspecified osteoarthritis, unspecified site: Secondary | ICD-10-CM | POA: Insufficient documentation

## 2019-10-07 DIAGNOSIS — Z794 Long term (current) use of insulin: Secondary | ICD-10-CM | POA: Insufficient documentation

## 2019-10-07 DIAGNOSIS — Z7982 Long term (current) use of aspirin: Secondary | ICD-10-CM | POA: Insufficient documentation

## 2019-10-07 HISTORY — PX: ANTERIOR CERVICAL DECOMP/DISCECTOMY FUSION: SHX1161

## 2019-10-07 LAB — GLUCOSE, CAPILLARY
Glucose-Capillary: 153 mg/dL — ABNORMAL HIGH (ref 70–99)
Glucose-Capillary: 245 mg/dL — ABNORMAL HIGH (ref 70–99)
Glucose-Capillary: 222 mg/dL — ABNORMAL HIGH (ref 70–99)
Glucose-Capillary: 290 mg/dL — ABNORMAL HIGH (ref 70–99)
Glucose-Capillary: 231 mg/dL — ABNORMAL HIGH (ref 70–99)

## 2019-10-07 SURGERY — ANTERIOR CERVICAL DECOMPRESSION/DISCECTOMY FUSION 1 LEVEL
Anesthesia: General | Site: Spine Cervical

## 2019-10-07 MED ORDER — MENTHOL 3 MG MT LOZG: 1.0000 | LOZENGE | OROMUCOSAL | Status: DC | PRN

## 2019-10-07 MED ORDER — THROMBIN (RECOMBINANT) 20000 UNITS EX SOLR
CUTANEOUS | Status: AC
  Filled 2019-10-07: qty 20000

## 2019-10-07 MED ORDER — MIDAZOLAM HCL 2 MG/2ML IJ SOLN
INTRAMUSCULAR | Status: AC
  Filled 2019-10-07: qty 2

## 2019-10-07 MED ORDER — PROPOFOL 10 MG/ML IV BOLUS
INTRAVENOUS | Status: DC | PRN
  Administered 2019-10-07: 200 mg via INTRAVENOUS

## 2019-10-07 MED ORDER — PHENYLEPHRINE 40 MCG/ML (10ML) SYRINGE FOR IV PUSH (FOR BLOOD PRESSURE SUPPORT)
PREFILLED_SYRINGE | INTRAVENOUS | Status: AC
  Filled 2019-10-07: qty 10

## 2019-10-07 MED ORDER — SUCCINYLCHOLINE CHLORIDE 200 MG/10ML IV SOSY
PREFILLED_SYRINGE | INTRAVENOUS | Status: DC | PRN
  Administered 2019-10-07: 120 mg via INTRAVENOUS

## 2019-10-07 MED ORDER — ONDANSETRON HCL 4 MG/2ML IJ SOLN
INTRAMUSCULAR | Status: DC | PRN
  Administered 2019-10-07: 4 mg via INTRAVENOUS

## 2019-10-07 MED ORDER — KETAMINE HCL 10 MG/ML IJ SOLN
INTRAMUSCULAR | Status: DC | PRN
  Administered 2019-10-07: 30 mg via INTRAVENOUS
  Administered 2019-10-07: 10 mg via INTRAVENOUS

## 2019-10-07 MED ORDER — LACTATED RINGERS IV SOLN: INTRAVENOUS | Status: DC

## 2019-10-07 MED ORDER — PHENOL 1.4 % MT LIQD
1.0000 | OROMUCOSAL | Status: DC | PRN
  Administered 2019-10-07: 1 via OROMUCOSAL
  Filled 2019-10-07: qty 177

## 2019-10-07 MED ORDER — DEXAMETHASONE SODIUM PHOSPHATE 10 MG/ML IJ SOLN
INTRAMUSCULAR | Status: AC
  Filled 2019-10-07: qty 1

## 2019-10-07 MED ORDER — METHOCARBAMOL 1000 MG/10ML IJ SOLN
500.0000 mg | Freq: Four times a day (QID) | INTRAVENOUS | Status: DC | PRN
  Filled 2019-10-07: qty 5

## 2019-10-07 MED ORDER — OXYCODONE HCL 5 MG PO TABS: 5.0000 mg | ORAL_TABLET | Freq: Once | ORAL | Status: DC | PRN

## 2019-10-07 MED ORDER — OXYCODONE HCL 5 MG/5ML PO SOLN: 5.0000 mg | Freq: Once | ORAL | Status: DC | PRN

## 2019-10-07 MED ORDER — 0.9 % SODIUM CHLORIDE (POUR BTL) OPTIME
TOPICAL | Status: DC | PRN
  Administered 2019-10-07 (×2): 1000 mL

## 2019-10-07 MED ORDER — PHENYLEPHRINE HCL-NACL 10-0.9 MG/250ML-% IV SOLN
INTRAVENOUS | Status: DC | PRN
  Administered 2019-10-07: 25 ug/min via INTRAVENOUS

## 2019-10-07 MED ORDER — OXYCODONE-ACETAMINOPHEN 10-325 MG PO TABS: 1.0000 | ORAL_TABLET | Freq: Four times a day (QID) | ORAL | 0 refills | Status: AC | PRN

## 2019-10-07 MED ORDER — ONDANSETRON HCL 4 MG/2ML IJ SOLN: 4.0000 mg | Freq: Four times a day (QID) | INTRAMUSCULAR | Status: DC | PRN

## 2019-10-07 MED ORDER — FENTANYL CITRATE (PF) 100 MCG/2ML IJ SOLN
INTRAMUSCULAR | Status: DC | PRN
  Administered 2019-10-07: 150 ug via INTRAVENOUS
  Administered 2019-10-07: 40 ug via INTRAVENOUS

## 2019-10-07 MED ORDER — OXYCODONE HCL 5 MG PO TABS
5.0000 mg | ORAL_TABLET | ORAL | Status: DC | PRN
  Filled 2019-10-07: qty 1

## 2019-10-07 MED ORDER — PROPOFOL 10 MG/ML IV BOLUS
INTRAVENOUS | Status: AC
  Filled 2019-10-07: qty 20

## 2019-10-07 MED ORDER — BUPIVACAINE HCL (PF) 0.25 % IJ SOLN
INTRAMUSCULAR | Status: AC
  Filled 2019-10-07: qty 30

## 2019-10-07 MED ORDER — HYDROMORPHONE HCL 1 MG/ML IJ SOLN
2.0000 mg | INTRAMUSCULAR | Status: DC | PRN
  Administered 2019-10-07: 2 mg via INTRAVENOUS
  Filled 2019-10-07: qty 2

## 2019-10-07 MED ORDER — DEXAMETHASONE SODIUM PHOSPHATE 4 MG/ML IJ SOLN
INTRAMUSCULAR | Status: DC | PRN
  Administered 2019-10-07: 4 mg via INTRAVENOUS

## 2019-10-07 MED ORDER — INSULIN DETEMIR 100 UNIT/ML ~~LOC~~ SOLN
30.0000 [IU] | Freq: Every day | SUBCUTANEOUS | Status: DC
  Administered 2019-10-07: 30 [IU] via SUBCUTANEOUS
  Filled 2019-10-07 (×2): qty 0.3

## 2019-10-07 MED ORDER — KETOROLAC TROMETHAMINE 30 MG/ML IJ SOLN
30.0000 mg | Freq: Once | INTRAMUSCULAR | Status: AC
  Administered 2019-10-07: 30 mg via INTRAVENOUS
  Filled 2019-10-07: qty 1

## 2019-10-07 MED ORDER — ONDANSETRON HCL 4 MG/2ML IJ SOLN: 4.0000 mg | Freq: Once | INTRAMUSCULAR | Status: DC | PRN

## 2019-10-07 MED ORDER — ALLOPURINOL 100 MG PO TABS
100.0000 mg | ORAL_TABLET | Freq: Every day | ORAL | Status: DC
  Administered 2019-10-08: 100 mg via ORAL
  Filled 2019-10-07: qty 1

## 2019-10-07 MED ORDER — SODIUM CHLORIDE 0.9% FLUSH: 3.0000 mL | INTRAVENOUS | Status: DC | PRN

## 2019-10-07 MED ORDER — LISINOPRIL 20 MG PO TABS
40.0000 mg | ORAL_TABLET | Freq: Every day | ORAL | Status: DC
  Administered 2019-10-07 – 2019-10-08 (×2): 40 mg via ORAL
  Filled 2019-10-07 (×2): qty 2

## 2019-10-07 MED ORDER — ACETAMINOPHEN 325 MG PO TABS
650.0000 mg | ORAL_TABLET | ORAL | Status: DC | PRN
  Administered 2019-10-07 – 2019-10-08 (×3): 650 mg via ORAL
  Filled 2019-10-07 (×2): qty 2

## 2019-10-07 MED ORDER — OXYCODONE HCL 5 MG PO TABS
10.0000 mg | ORAL_TABLET | ORAL | Status: DC | PRN
  Administered 2019-10-07 – 2019-10-08 (×4): 10 mg via ORAL
  Filled 2019-10-07 (×4): qty 2

## 2019-10-07 MED ORDER — ROCURONIUM BROMIDE 10 MG/ML (PF) SYRINGE
PREFILLED_SYRINGE | INTRAVENOUS | Status: DC | PRN
  Administered 2019-10-07: 80 mg via INTRAVENOUS
  Administered 2019-10-07: 20 mg via INTRAVENOUS

## 2019-10-07 MED ORDER — INSULIN ASPART 100 UNIT/ML ~~LOC~~ SOLN
SUBCUTANEOUS | Status: AC
  Filled 2019-10-07: qty 1

## 2019-10-07 MED ORDER — INSULIN ASPART 100 UNIT/ML ~~LOC~~ SOLN
6.0000 [IU] | Freq: Once | SUBCUTANEOUS | Status: AC
  Administered 2019-10-07: 6 [IU] via SUBCUTANEOUS

## 2019-10-07 MED ORDER — MEPERIDINE HCL 25 MG/ML IJ SOLN: 6.2500 mg | INTRAMUSCULAR | Status: DC | PRN

## 2019-10-07 MED ORDER — LIDOCAINE 2% (20 MG/ML) 5 ML SYRINGE
INTRAMUSCULAR | Status: AC
  Filled 2019-10-07: qty 5

## 2019-10-07 MED ORDER — THROMBIN 20000 UNITS EX SOLR: CUTANEOUS | Status: DC | PRN

## 2019-10-07 MED ORDER — EPINEPHRINE PF 1 MG/ML IJ SOLN
INTRAMUSCULAR | Status: AC
  Filled 2019-10-07: qty 1

## 2019-10-07 MED ORDER — METHOCARBAMOL 500 MG PO TABS
500.0000 mg | ORAL_TABLET | Freq: Four times a day (QID) | ORAL | Status: DC | PRN
  Administered 2019-10-07 – 2019-10-08 (×3): 500 mg via ORAL
  Filled 2019-10-07 (×3): qty 1

## 2019-10-07 MED ORDER — SUCCINYLCHOLINE CHLORIDE 200 MG/10ML IV SOSY
PREFILLED_SYRINGE | INTRAVENOUS | Status: AC
  Filled 2019-10-07: qty 10

## 2019-10-07 MED ORDER — VANCOMYCIN HCL 1500 MG/300ML IV SOLN
1500.0000 mg | Freq: Once | INTRAVENOUS | Status: AC
  Administered 2019-10-07: 1500 mg via INTRAVENOUS
  Filled 2019-10-07 (×2): qty 300

## 2019-10-07 MED ORDER — LIDOCAINE 2% (20 MG/ML) 5 ML SYRINGE
INTRAMUSCULAR | Status: DC | PRN
  Administered 2019-10-07: 100 mg via INTRAVENOUS

## 2019-10-07 MED ORDER — SODIUM CHLORIDE 0.9% FLUSH
3.0000 mL | Freq: Two times a day (BID) | INTRAVENOUS | Status: DC
  Administered 2019-10-07: 3 mL via INTRAVENOUS

## 2019-10-07 MED ORDER — METHOCARBAMOL 500 MG PO TABS: 500.0000 mg | ORAL_TABLET | Freq: Three times a day (TID) | ORAL | 0 refills | Status: AC | PRN

## 2019-10-07 MED ORDER — EPINEPHRINE PF 1 MG/ML IJ SOLN
INTRAMUSCULAR | Status: DC | PRN
  Administered 2019-10-07: 1 mg

## 2019-10-07 MED ORDER — FENTANYL CITRATE (PF) 250 MCG/5ML IJ SOLN
INTRAMUSCULAR | Status: AC
  Filled 2019-10-07: qty 5

## 2019-10-07 MED ORDER — PHENYLEPHRINE 40 MCG/ML (10ML) SYRINGE FOR IV PUSH (FOR BLOOD PRESSURE SUPPORT)
PREFILLED_SYRINGE | INTRAVENOUS | Status: DC | PRN
  Administered 2019-10-07: 120 ug via INTRAVENOUS
  Administered 2019-10-07: 80 ug via INTRAVENOUS
  Administered 2019-10-07: 120 ug via INTRAVENOUS
  Administered 2019-10-07: 200 ug via INTRAVENOUS
  Administered 2019-10-07: 80 ug via INTRAVENOUS

## 2019-10-07 MED ORDER — MIDAZOLAM HCL 5 MG/5ML IJ SOLN
INTRAMUSCULAR | Status: DC | PRN
  Administered 2019-10-07: 2 mg via INTRAVENOUS

## 2019-10-07 MED ORDER — FUROSEMIDE 20 MG PO TABS
20.0000 mg | ORAL_TABLET | ORAL | Status: DC
  Administered 2019-10-08: 20 mg via ORAL
  Filled 2019-10-07: qty 1

## 2019-10-07 MED ORDER — ACETAMINOPHEN 10 MG/ML IV SOLN
INTRAVENOUS | Status: AC
  Filled 2019-10-07: qty 100

## 2019-10-07 MED ORDER — KETAMINE HCL 50 MG/5ML IJ SOSY
PREFILLED_SYRINGE | INTRAMUSCULAR | Status: AC
  Filled 2019-10-07: qty 5

## 2019-10-07 MED ORDER — HYDROMORPHONE HCL 1 MG/ML IJ SOLN
INTRAMUSCULAR | Status: AC
  Filled 2019-10-07: qty 1

## 2019-10-07 MED ORDER — GLIPIZIDE 5 MG PO TABS
10.0000 mg | ORAL_TABLET | Freq: Every day | ORAL | Status: DC
  Administered 2019-10-08: 10 mg via ORAL
  Filled 2019-10-07: qty 2

## 2019-10-07 MED ORDER — ONDANSETRON HCL 4 MG/2ML IJ SOLN
INTRAMUSCULAR | Status: AC
  Filled 2019-10-07: qty 2

## 2019-10-07 MED ORDER — HYDROMORPHONE HCL 1 MG/ML IJ SOLN
0.2500 mg | INTRAMUSCULAR | Status: DC | PRN
  Administered 2019-10-07 (×4): 0.5 mg via INTRAVENOUS

## 2019-10-07 MED ORDER — ACETAMINOPHEN 10 MG/ML IV SOLN: 1000.0000 mg | Freq: Once | INTRAVENOUS | Status: DC | PRN

## 2019-10-07 MED ORDER — PRAVASTATIN SODIUM 10 MG PO TABS
20.0000 mg | ORAL_TABLET | Freq: Every day | ORAL | Status: DC
  Administered 2019-10-08: 20 mg via ORAL
  Filled 2019-10-07: qty 2

## 2019-10-07 MED ORDER — ACETAMINOPHEN 650 MG RE SUPP: 650.0000 mg | RECTAL | Status: DC | PRN

## 2019-10-07 MED ORDER — ROCURONIUM BROMIDE 10 MG/ML (PF) SYRINGE
PREFILLED_SYRINGE | INTRAVENOUS | Status: AC
  Filled 2019-10-07: qty 10

## 2019-10-07 MED ORDER — ONDANSETRON HCL 4 MG PO TABS: 4.0000 mg | ORAL_TABLET | Freq: Every day | ORAL | 0 refills | Status: AC | PRN

## 2019-10-07 MED ORDER — AMLODIPINE BESYLATE 5 MG PO TABS
5.0000 mg | ORAL_TABLET | Freq: Every day | ORAL | Status: DC
  Administered 2019-10-08: 5 mg via ORAL
  Filled 2019-10-07: qty 1

## 2019-10-07 MED ORDER — ACETAMINOPHEN 10 MG/ML IV SOLN: 1000.0000 mg | Freq: Once | INTRAVENOUS | Status: AC

## 2019-10-07 MED ORDER — SODIUM CHLORIDE 0.9 % IV SOLN: 250.0000 mL | INTRAVENOUS | Status: DC

## 2019-10-07 MED ORDER — HEMOSTATIC AGENTS (NO CHARGE) OPTIME
TOPICAL | Status: DC | PRN
  Administered 2019-10-07: 1

## 2019-10-07 MED ORDER — ONDANSETRON HCL 4 MG PO TABS: 4.0000 mg | ORAL_TABLET | Freq: Four times a day (QID) | ORAL | Status: DC | PRN

## 2019-10-07 MED ORDER — BUPIVACAINE HCL (PF) 0.25 % IJ SOLN
INTRAMUSCULAR | Status: DC | PRN
  Administered 2019-10-07: 10 mL

## 2019-10-07 SURGICAL SUPPLY — 68 items
AGENT HMST KT MTR STRL THRMB (HEMOSTASIS) ×1
BLADE CLIPPER SURG (BLADE) IMPLANT
BONE VIVIGEN FORMABLE 1.3CC (Bone Implant) ×3 IMPLANT
BUR MATCHSTICK NEURO 3.0 LAGG (BURR) ×2 IMPLANT
CABLE BIPOLOR RESECTION CORD (MISCELLANEOUS) ×3 IMPLANT
CAGE SPNL 6D 14XMED 16X7X (Cage) IMPLANT
CANISTER SUCT 3000ML PPV (MISCELLANEOUS) ×3 IMPLANT
CLOSURE STERI-STRIP 1/2X4 (GAUZE/BANDAGES/DRESSINGS) ×1
CLSR STERI-STRIP ANTIMIC 1/2X4 (GAUZE/BANDAGES/DRESSINGS) ×2 IMPLANT
COVER MAYO STAND STRL (DRAPES) ×9 IMPLANT
COVER PLATE (Plate) ×2 IMPLANT
COVER SURGICAL LIGHT HANDLE (MISCELLANEOUS) ×6 IMPLANT
COVER WAND RF STERILE (DRAPES) ×3 IMPLANT
DRAPE C-ARM 42X72 X-RAY (DRAPES) ×3 IMPLANT
DRAPE POUCH INSTRU U-SHP 10X18 (DRAPES) ×3 IMPLANT
DRAPE SURG 17X23 STRL (DRAPES) ×3 IMPLANT
DRAPE U-SHAPE 47X51 STRL (DRAPES) ×3 IMPLANT
DRSG OPSITE POSTOP 3X4 (GAUZE/BANDAGES/DRESSINGS) ×3 IMPLANT
DRSG OPSITE POSTOP 4X6 (GAUZE/BANDAGES/DRESSINGS) ×2 IMPLANT
DURAPREP 26ML APPLICATOR (WOUND CARE) ×3 IMPLANT
ELECT COATED BLADE 2.86 ST (ELECTRODE) ×3 IMPLANT
ELECT PENCIL ROCKER SW 15FT (MISCELLANEOUS) ×3 IMPLANT
ELECT REM PT RETURN 9FT ADLT (ELECTROSURGICAL) ×3
ELECTRODE REM PT RTRN 9FT ADLT (ELECTROSURGICAL) ×1 IMPLANT
FUSION TCS NANOLOCK 7MM 6DEG (Cage) ×3 IMPLANT
GLOVE BIO SURGEON STRL SZ 6.5 (GLOVE) ×2 IMPLANT
GLOVE BIO SURGEONS STRL SZ 6.5 (GLOVE) ×1
GLOVE BIOGEL PI IND STRL 6.5 (GLOVE) ×1 IMPLANT
GLOVE BIOGEL PI IND STRL 8.5 (GLOVE) ×1 IMPLANT
GLOVE BIOGEL PI INDICATOR 6.5 (GLOVE) ×2
GLOVE BIOGEL PI INDICATOR 8.5 (GLOVE) ×2
GLOVE SS BIOGEL STRL SZ 8.5 (GLOVE) ×1 IMPLANT
GLOVE SUPERSENSE BIOGEL SZ 8.5 (GLOVE) ×2
GOWN STRL REUS W/ TWL LRG LVL3 (GOWN DISPOSABLE) ×1 IMPLANT
GOWN STRL REUS W/TWL 2XL LVL3 (GOWN DISPOSABLE) ×3 IMPLANT
GOWN STRL REUS W/TWL LRG LVL3 (GOWN DISPOSABLE) ×3
GRAFT BNE MATRIX VG FRMBL SM 1 (Bone Implant) IMPLANT
KIT BASIN OR (CUSTOM PROCEDURE TRAY) ×3 IMPLANT
KIT TURNOVER KIT B (KITS) ×3 IMPLANT
NDL SPNL 18GX3.5 QUINCKE PK (NEEDLE) ×1 IMPLANT
NEEDLE HYPO 22GX1.5 SAFETY (NEEDLE) ×3 IMPLANT
NEEDLE SPNL 18GX3.5 QUINCKE PK (NEEDLE) ×3 IMPLANT
NS IRRIG 1000ML POUR BTL (IV SOLUTION) ×3 IMPLANT
PACK ORTHO CERVICAL (CUSTOM PROCEDURE TRAY) ×3 IMPLANT
PACK UNIVERSAL I (CUSTOM PROCEDURE TRAY) ×3 IMPLANT
PAD ARMBOARD 7.5X6 YLW CONV (MISCELLANEOUS) ×6 IMPLANT
PATTIES SURGICAL .25X.25 (GAUZE/BANDAGES/DRESSINGS) IMPLANT
PIN DISTRACTION 14 (PIN) ×4 IMPLANT
PLATE LOCK ENDO TCS (Plate) ×1 IMPLANT
PLATE LOCK ENDO TCS F/COVER (Plate) IMPLANT
POSITIONER HEAD DONUT 9IN (MISCELLANEOUS) ×3 IMPLANT
RESTRAINT LIMB HOLDER UNIV (RESTRAINTS) ×3 IMPLANT
SCREW ENDO BONE 3.8X14MM (Screw) ×4 IMPLANT
SPONGE INTESTINAL PEANUT (DISPOSABLE) ×3 IMPLANT
SPONGE SURGIFOAM ABS GEL 100 (HEMOSTASIS) ×2 IMPLANT
SPONGE SURGIFOAM ABS GEL SZ50 (HEMOSTASIS) ×3 IMPLANT
SURGIFLO W/THROMBIN 8M KIT (HEMOSTASIS) ×2 IMPLANT
SUT BONE WAX W31G (SUTURE) ×3 IMPLANT
SUT MON AB 3-0 SH 27 (SUTURE) ×3
SUT MON AB 3-0 SH27 (SUTURE) ×1 IMPLANT
SUT VIC AB 2-0 CT1 18 (SUTURE) ×3 IMPLANT
SYR BULB IRRIGATION 50ML (SYRINGE) ×3 IMPLANT
SYR CONTROL 10ML LL (SYRINGE) ×3 IMPLANT
TAPE CLOTH 4X10 WHT NS (GAUZE/BANDAGES/DRESSINGS) ×3 IMPLANT
TAPE UMBILICAL COTTON 1/8X30 (MISCELLANEOUS) ×3 IMPLANT
TOWEL GREEN STERILE (TOWEL DISPOSABLE) ×3 IMPLANT
TOWEL GREEN STERILE FF (TOWEL DISPOSABLE) ×3 IMPLANT
WATER STERILE IRR 1000ML POUR (IV SOLUTION) ×3 IMPLANT

## 2019-10-07 NOTE — Brief Op Note (Signed)
10/07/2019  10:24 AM  PATIENT:  Brett Wallace  59 y.o. 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  PROCEDURE:  Procedure(s) with comments: ANTERIOR CERVICAL DECOMPRESSION/DISCECTOMY FUSION CERVICAL THREE- CERVICAL FOUR (N/A) - 3 hrs  SURGEON:  Surgeon(s) and Role:    Venita Lick, MD - Primary  PHYSICIAN ASSISTANT:   ASSISTANTS: Amanda Ward, PA   ANESTHESIA:   none  EBL:  30 mL   BLOOD ADMINISTERED:none  DRAINS: none   LOCAL MEDICATIONS USED:  MARCAINE     SPECIMEN:  No Specimen  DISPOSITION OF SPECIMEN:  N/A  COUNTS:  YES  TOURNIQUET:  * No tourniquets in log *  DICTATION: .Dragon Dictation  PLAN OF CARE: Admit for overnight observation  PATIENT DISPOSITION:  PACU - hemodynamically stable.

## 2019-10-07 NOTE — Progress Notes (Signed)
Called on call MD-Dr Aundria Rud due to patient's uncontrolled surgical pain- neck/head . New order received- Dilaudid PRN and Toradol 30 mg IV x1. New med ordered effective.

## 2019-10-07 NOTE — Anesthesia Postprocedure Evaluation (Signed)
Anesthesia Post Note  Patient: Nolawi Kanady  Procedure(s) Performed: ANTERIOR CERVICAL DECOMPRESSION/DISCECTOMY FUSION CERVICAL THREE- CERVICAL FOUR (N/A Spine Cervical)     Patient location during evaluation: PACU Anesthesia Type: General Level of consciousness: awake and alert Pain management: pain level controlled Vital Signs Assessment: post-procedure vital signs reviewed and stable Respiratory status: spontaneous breathing, nonlabored ventilation, respiratory function stable and patient connected to nasal cannula oxygen Cardiovascular status: blood pressure returned to baseline and stable Postop Assessment: no apparent nausea or vomiting Anesthetic complications: no    Last Vitals:  Vitals:   10/07/19 1145 10/07/19 1200  BP: 135/69 (!) 152/75  Pulse: 93 92  Resp: 19 19  Temp:    SpO2: 92% 94%    Last Pain:  Vitals:   10/07/19 1200  TempSrc:   PainSc: 9                  Trevor Iha

## 2019-10-07 NOTE — Op Note (Signed)
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  Previous ACDF C4-5 with adjacent segment degeneration C3-4.  Postoperative diagnosis: Same  Operative procedure: Anterior cervical discectomy and fusion C3-4  First assistant [facility] Ward, PA  Implants: 7 mm medium Titan 0 profile intervertebral cage.  Affixed with 3.8 x 14 mm screws.  Graft: vivogen  Indications: Gross is a very pleasant 59 year old gentleman who I performed an ACDF on C6-7 several years ago.  Subsequently he ultimately had an ACDF at C4-5 because of disc pathology by another provider.  The patient ultimately return to see me because of ongoing neck and trapezial pain.  Imaging studies demonstrated degenerative adjacent segment C4 3 4 disease with foraminal stenosis causing C4 nerve irritation.  Patient underwent a C4 selective nerve root block and had temporary significant improvement in his pain.  As result of the clinical findings and the positive diagnostic injection we elected to move forward with surgery.  All appropriate risks benefits and alternatives were discussed patient and consent was obtained  Operative report: Patient was brought the operating room placed upon the operating room table.  After successful induction general anesthesia endotracheal ovation teds SCDs were applied and the anterior cervical spine was prepped and draped in a standard fashion.  Timeout was taken to confirm patient procedure and all other important data.  Intraoperative fluoroscopic views were used to identify the C3-4 level and I marked out the incision site.  I infiltrated this and then made an incision starting at the midline and proceeding to the right.  Sharp dissection was carried out down to the platysma.  I performed a standard Smith-Robinson approach to the anterior cervical spine via right-sided transverse incision.  I sharply dissected along the medial border cleidomastoid into the deep cervical  fascia.  I was then able to bluntly dissect through the remaining deep cervical and prevertebral fascia until I was able to palpate the anterior cervical spine.  I swept the trachea and esophagus to the left and identified and protected the carotid sheath to the right.  Hand-held retractor was used to retract the esophagus and trachea as I began completely exposing the anterior longitudinal ligament.  Once I had mobilized the entire prevertebral fascia and expose the ALL I placed the needle into the C3-4 disc space and confirmed that was at the appropriate level.  I then mobilized the disc coli muscles laterally with bipolar electrocautery to expose the uncovertebral joints.  Self-retaining retractor was placed underneath the longus coli muscle and the endotracheal cuff was deflated and expanded the retractor to the appropriate width.  The endotracheal cuff was reinflated and continued with the discectomy.  Annulotomy was performed with a 15 blade scalpel and I used pituitary rongeurs curettes and Kerrison rongeurs to remove all of the overhanging bone spur and disc material.  I placed distraction pins into the body of C3 and C4 gently distracted the intervertebral space and maintained with the distraction pin set.  I continue working posteriorly with the fine curettes removing the posterior disc and annulus.  I then used a fine nerve hook to develop a plane underneath the posterior annulus and resected this with a 1 mm Kerrison rongeur.  I was able to create a plane underneath the posterior longitudinal ligament and also resect this with a 1 mm Kerrison rongeur I was then able to undercut the osteophyte from the posterior aspect of the vertebral bodies of C3 and 4 as well as undercut the uncovertebral joint.  Hard  disc osteophyte was removed from the left posterior lateral corner consistent with what was seen on the preoperative CT scan.  At this point I could now freely pass my nerve hook under the uncovertebral  joint and vertebral body confirming my decompression.  I then trialed the intervertebral space and elected to use the size 7 medium lordotic cage.  The cage was packed with the graft and malleted to the appropriate depth.  I then remove the traction pins and sealed their holes with bone wax.  I then debrided some of the bone spur from the superior aspect of the C4 vertebral body in order to accommodate placing the screw.  I used the awl to broach the cortex and then placed a 14 mm 3.8 diameter screw.  The screw had excellent purchase.  1 screw was placed superiorly to the body of C3 and 1 inferiorly to the body of C4.  A locking plate was then secured over this and torqued according manufacture standards.  At this point I irrigated the wound copiously normal saline and made sure that hemostasis using proper electrocautery and FloSeal.  Final intraoperative AP and lateral fluoroscopy views demonstrated satisfactory positioning of the implant as well as restoration of disc space height and indirect foraminal decompression.  The trachea and esophagus were returned to midline and I closed the platysma with interrupted 2-0 Vicryl suture and the skin with 3-0 Monocryl.  Steri-Strips dry dressings were applied and the patient was ultimately extubated transfer the PACU without incident.  The end of the case all needle sponge counts were correct.  There were no adverse intraoperative events.

## 2019-10-07 NOTE — Transfer of Care (Signed)
Immediate Anesthesia Transfer of Care Note  Patient: Brett Wallace  Procedure(s) Performed: ANTERIOR CERVICAL DECOMPRESSION/DISCECTOMY FUSION CERVICAL THREE- CERVICAL FOUR (N/A Spine Cervical)  Patient Location: PACU  Anesthesia Type:General  Level of Consciousness: oriented, sedated, drowsy and patient cooperative  Airway & Oxygen Therapy: Patient Spontanous Breathing and Patient connected to nasal cannula oxygen  Post-op Assessment: Report given to RN and Post -op Vital signs reviewed and stable  Post vital signs: Reviewed  Last Vitals:  Vitals Value Taken Time  BP 154/69 10/07/19 1045  Temp    Pulse 99 10/07/19 1047  Resp 23 10/07/19 1047  SpO2 96 % 10/07/19 1047  Vitals shown include unvalidated device data.  Last Pain:  Vitals:   10/07/19 0639  TempSrc:   PainSc: 7       Patients Stated Pain Goal: 5 (10/07/19 3391)  Complications: No apparent anesthesia complications

## 2019-10-07 NOTE — Anesthesia Procedure Notes (Signed)
Procedure Name: Intubation Date/Time: 10/07/2019 7:36 AM Performed by: Lovie Chol, CRNA Pre-anesthesia Checklist: Patient identified, Emergency Drugs available, Suction available and Patient being monitored Patient Re-evaluated:Patient Re-evaluated prior to induction Oxygen Delivery Method: Circle System Utilized Preoxygenation: Pre-oxygenation with 100% oxygen Induction Type: IV induction Ventilation: Mask ventilation without difficulty Laryngoscope Size: Glidescope and 4 Grade View: Grade I Tube type: Oral Tube size: 7.5 mm Number of attempts: 1 Airway Equipment and Method: Stylet and Oral airway Placement Confirmation: ETT inserted through vocal cords under direct vision,  positive ETCO2 and breath sounds checked- equal and bilateral Secured at: 23 cm Tube secured with: Tape Dental Injury: Teeth and Oropharynx as per pre-operative assessment

## 2019-10-07 NOTE — H&P (Signed)
Addendum H&P: There has been no change in the patient's clinical exam since his last office visit of 09/27/2019.  He continues to have severe neck and C4 radicular pain.  Despite appropriate clinical management consisting of injection therapy and physical therapy his overall quality of life is continued to deteriorate.  Imaging studies confirm adjacent segment degeneration from his previous ACDF.  As result of the failure of conservative management we are planning on moving forward with addressing the adjacent degenerated C3-4 level.  All appropriate risks benefits and alternatives were discussed with the patient and consent was obtained.

## 2019-10-08 ENCOUNTER — Encounter: Payer: Self-pay | Admitting: *Deleted

## 2019-10-08 DIAGNOSIS — M5011 Cervical disc disorder with radiculopathy,  high cervical region: Secondary | ICD-10-CM | POA: Diagnosis not present

## 2019-10-08 LAB — GLUCOSE, CAPILLARY
Glucose-Capillary: 152 mg/dL — ABNORMAL HIGH (ref 70–99)
Glucose-Capillary: 191 mg/dL — ABNORMAL HIGH (ref 70–99)

## 2019-10-08 MED FILL — Thrombin (Recombinant) For Soln 20000 Unit: CUTANEOUS | Qty: 1 | Status: AC

## 2019-10-08 NOTE — Evaluation (Signed)
Occupational Therapy Evaluation and Discharge Patient Details Name: Brett Wallace MRN: [id number] DOB: 1960/11/26 Today's Date: 10/08/2019    History of Present Illness s/p C 3-4 ACDF   Clinical Impression   Pt reports this is his 3rd cervical sx. Reinforced precautions and compensatory strategies for ADL. Pt has an aide who comes daily to help with IADL and can supervise tub transfers. Pt verbalized and/or demonstrated understanding. No further OT needs.   Follow Up Recommendations  No OT follow up    Equipment Recommendations  None recommended by OT    Recommendations for Other Services       Precautions / Restrictions Precautions Precautions: Cervical Precaution Booklet Issued: Yes (comment) Required Braces or Orthoses: Cervical Brace Cervical Brace: Hard collar      Mobility Bed Mobility               General bed mobility comments: in chair  Transfers Overall transfer level: Modified independent Equipment used: Straight cane                  Balance Overall balance assessment: Mild deficits observed, not formally tested                                         ADL either performed or assessed with clinical judgement   ADL Overall ADL's : Modified independent                                       General ADL Comments: educated in cervical precautions related to ADL and IADL and compensatory strategies     Vision 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) Patient Visual Report: No change from baseline       Perception     Praxis      Pertinent Vitals/Pain Pain Assessment: Faces Faces Pain Scale: Hurts little more Pain Location: head Pain Descriptors / Indicators: Aching Pain Intervention(s): RN gave pain meds during session     Hand Dominance Right   Extremity/Trunk Assessment Upper Extremity Assessment Upper Extremity Assessment: Overall WFL for tasks assessed   Lower Extremity Assessment Lower  Extremity Assessment: Defer to PT evaluation   Cervical / Trunk Assessment Cervical / Trunk Assessment: Other exceptions Cervical / Trunk Exceptions: s/p sx   Communication Communication Communication: No difficulties   Cognition Arousal/Alertness: Awake/alert Behavior During Therapy: WFL for tasks assessed/performed Overall Cognitive Status: Within Functional Limits for tasks assessed                                     General Comments       Exercises     Shoulder Instructions      Home Living Family/patient expects to be discharged to:: Private residence Living Arrangements: Alone Available Help at Discharge: Personal care attendant(daily x 3 hours) Type of Home: Apartment Home Access: Elevator     Home Layout: One level     Bathroom Shower/Tub: Teacher, early years/pre: Standard     Home Equipment: Cane - single point   Additional Comments: pt plans to order a shower seat from a catalog his insurance provided      Prior Functioning/Environment Level of Independence: Independent with assistive device(s)  Comments: aide manages housekeeping        OT Problem List:        OT Treatment/Interventions:      OT Goals(Current goals can be found in the care plan section) Acute Rehab OT Goals Patient Stated Goal: return to baseline  OT Frequency:     Barriers to D/C:            Co-evaluation              AM-PAC OT "6 Clicks" Daily Activity     Outcome Measure Help from another person eating meals?: None Help from another person taking care of personal grooming?: None Help from another person toileting, which includes using toliet, bedpan, or urinal?: None Help from another person bathing (including washing, rinsing, drying)?: None Help from another person to put on and taking off regular upper body clothing?: None Help from another person to put on and taking off regular lower body clothing?: None 6 Click Score:  24   End of Session Equipment Utilized During Treatment: Cervical collar  Activity Tolerance: Patient tolerated treatment well Patient left: in chair;with call bell/phone within reach  OT Visit Diagnosis: Pain                Time: 0815-0829 OT Time Calculation (min): 14 min Charges:  OT General Charges $OT Visit: 1 Visit OT Evaluation $OT Eval Low Complexity: 1 Low  Martie Round, OTR/L Acute Rehabilitation Services Pager: [phone] Office: [phone]  Evern Bio 10/08/2019, 8:36 AM

## 2019-10-08 NOTE — Evaluation (Signed)
Physical Therapy Evaluation Patient Details Name: Brett Wallace MRN: [id number] DOB: Dec 01, 1960 Today's Date: 10/08/2019   History of Present Illness  s/p C 3-4 ACDF  Clinical Impression  Pt presents with good understanding and application of spinal precautions, increased time and effort to mobilize, steadiness during gait with use of straight cane, WFL strength and sensation UEs and LEs, and decreased activity tolerance post-surgery. Pt ambulated good hallway distance with PT today, and demonstrated good application of spinal precautions for bed mobility, transfers, and gait. Pt to have assist from aide 3 hours/day and from brother who lives in the same building as pt. PT educated pt on home walking program, up and walking every waking hour for 5-10 minutes at a time to pt tolerance with supervision from family member/aide, for circulation and to prevent back stiffness/pain. Pt with no further acute PT needs, and pt with no further questions. Pt to d/c today.      Follow Up Recommendations Follow surgeon's recommendation for DC plan and follow-up therapies;Supervision for mobility/OOB    Equipment Recommendations  None recommended by PT    Recommendations for Other Services       Precautions / Restrictions Precautions Precautions: Cervical Precaution Booklet Issued: Yes (comment) Precaution Comments: verbally reviewed no bending, lifting, twisting, arching, and keeping head still and secure in brace. Pt demonstrated great knowledge and understanding of precautions Required Braces or Orthoses: Cervical Brace Cervical Brace: Hard collar Restrictions Weight Bearing Restrictions: No      Mobility  Bed Mobility Overal bed mobility: Modified Independent Bed Mobility: Rolling;Sidelying to Sit;Sit to Sidelying Rolling: Modified independent (Device/Increase time) Sidelying to sit: Modified independent (Device/Increase time)     Sit to sidelying: Modified independent (Device/Increase  time) General bed mobility comments: Mod I for increased time to perform, pt with proper sequencing and log roll technique.  Transfers Overall transfer level: Modified independent Equipment used: Straight cane             General transfer comment: Increased time to rise and steady, reaches for cane after standing with no unsteadiness noted  Ambulation/Gait Ambulation/Gait assistance: Supervision Gait Distance (Feet): 150 Feet Assistive device: Straight cane Gait Pattern/deviations: Step-through pattern;Decreased stride length Gait velocity: decr   General Gait Details: supervision for safety, WFL gait and sequencing with use of cane.  Stairs Stairs: (has Engineer, structural)          Wheelchair Mobility    Modified Rankin (Stroke Patients Only)       Balance Overall balance assessment: Mild deficits observed, not formally tested                                           Pertinent Vitals/Pain Pain Assessment: Faces Faces Pain Scale: Hurts a little bit Pain Location: head Pain Descriptors / Indicators: Aching Pain Intervention(s): Limited activity within patient's tolerance;Monitored during session    Home Living Family/patient expects to be discharged to:: Private residence Living Arrangements: Alone Available Help at Discharge: Personal care attendant(daily x 3 hours) Type of Home: Apartment Home Access: Elevator     Home Layout: One level Home Equipment: Cane - single point Additional Comments: pt plans to order a shower seat from a catalog his insurance provided    Prior Function Level of Independence: Independent with assistive device(s)         Comments: aide manages housekeeping     Hand Dominance   Dominant  Hand: Right    Extremity/Trunk Assessment   Upper Extremity Assessment Upper Extremity Assessment: Defer to OT evaluation    Lower Extremity Assessment Lower Extremity Assessment: Overall WFL for tasks assessed     Cervical / Trunk Assessment Cervical / Trunk Assessment: Other exceptions Cervical / Trunk Exceptions: s/p sx  Communication   Communication: No difficulties  Cognition Arousal/Alertness: Awake/alert Behavior During Therapy: WFL for tasks assessed/performed Overall Cognitive Status: Within Functional Limits for tasks assessed                                        General Comments      Exercises     Assessment/Plan    PT Assessment Patent does not need any further PT services  PT Problem List         PT Treatment Interventions      PT Goals (Current goals can be found in the Care Plan section)  Acute Rehab PT Goals Patient Stated Goal: return to baseline PT Goal Formulation: With patient Time For Goal Achievement: 10/08/19 Potential to Achieve Goals: Good    Frequency     Barriers to discharge        Co-evaluation               AM-PAC PT "6 Clicks" Mobility  Outcome Measure Help needed turning from your back to your side while in a flat bed without using bedrails?: None Help needed moving from lying on your back to sitting on the side of a flat bed without using bedrails?: None Help needed moving to and from a bed to a chair (including a wheelchair)?: None Help needed standing up from a chair using your arms (Wallace.g., wheelchair or bedside chair)?: None Help needed to walk in hospital room?: A Little Help needed climbing 3-5 steps with a railing? : A Little 6 Click Score: 22    End of Session Equipment Utilized During Treatment: Cervical collar Activity Tolerance: Patient tolerated treatment well Patient left: in chair;with call bell/phone within reach Nurse Communication: Mobility status PT Visit Diagnosis: Other abnormalities of gait and mobility (R26.89)    Time: 2992-4268 PT Time Calculation (min) (ACUTE ONLY): 8 min   Charges:   PT Evaluation $PT Eval Low Complexity: 1 Low          Brett Wallace, PT Acute Rehabilitation  Services Pager [phone]  Office [phone]   Brett Wallace 10/08/2019, 10:56 AM

## 2019-10-08 NOTE — Progress Notes (Addendum)
Subjective: 1 Day Post-Op Procedure(s) (LRB): ANTERIOR CERVICAL DECOMPRESSION/DISCECTOMY FUSION CERVICAL THREE- CERVICAL FOUR (N/A) Patient reports pain as mild.  Radicular arm pain resolved. No significant difficulty with swallowing.  Received Dilaudid and Toradol last night from on call provider due to significant pain. Tolerating PO w/o N/V. +ambulation +void Denies CP, calf pain, SOB  Objective: Vital signs in last 24 hours: Temp:  [98 F (36.7 C)-99.4 F (37.4 C)] 98.4 F (36.9 C) (03/19 0353) Pulse Rate:  [85-104] 85 (03/19 0353) Resp:  [15-28] 20 (03/19 0353) BP: (135-170)/(65-87) 157/67 (03/19 0353) SpO2:  [92 %-98 %] 94 % (03/19 0353)  Intake/Output from previous day: 03/18 0701 - 03/19 0700 In: 1400 [P.O.:50; I.V.:1350] Out: 280 [Urine:250; Blood:30] Intake/Output this shift: No intake/output data recorded.  Recent Labs    10/05/19 1200  HGB 14.4   Recent Labs    10/05/19 1200  WBC 8.6  RBC 4.72  HCT 45.1  PLT 211   Recent Labs    10/05/19 1200  NA 140  K 3.8  CL 102  CO2 26  BUN 13  CREATININE 1.35*  GLUCOSE 172*  CALCIUM 9.2   Recent Labs    10/05/19 1200  INR 1.1    Neurologically intact ABD soft Neurovascular intact Sensation intact distally Intact pulses distally Incision: dressing C/D/I Compartment soft  Shoulder, elbow, wrist ROM intact  Assessment/Plan: 1 Day Post-Op Procedure(s) (LRB): ANTERIOR CERVICAL DECOMPRESSION/DISCECTOMY FUSION CERVICAL THREE- CERVICAL FOUR (N/A) Advance diet Up with therapy DVT PPx: Teds, SCDs, ambulation Encourage IS Plan D/c Today  Follow-up in office with Dr. Shon Baton in 2 weeks.   Leonette Monarch Ward 10/08/2019, 7:03 AM   Agree with above.  Patient is doing exceptionally well.  No swelling or difficulty breathing.  Neurologically intact.  Ambulating without great difficulty.  Plan on discharge to home and follow-up with me in 2 weeks.

## 2019-10-08 NOTE — Plan of Care (Signed)
Patient alert and oriented, mae's well, voiding adequate amount of urine, swallowing without difficulty, no c/o pain at time of discharge. Patient discharged home with family. Script and discharged instructions given to patient. Patient and family stated understanding of instructions given. Patient has an appointment with Dr. Brooks  

## 2019-10-11 NOTE — Discharge Summary (Signed)
Patient ID: Brett Wallace MRN: [id number] DOB/AGE: 57/18/1962 57 y.o.  Admit date: 10/07/2019 Discharge date: 10/11/2019  Admission Diagnoses:  Active Problems:   DDD (degenerative disc disease), cervical   Discharge Diagnoses:  Active Problems:   DDD (degenerative disc disease), cervical  status post Procedure(s): ANTERIOR CERVICAL DECOMPRESSION/DISCECTOMY FUSION CERVICAL THREE- CERVICAL FOUR  Past Medical History:  Diagnosis Date  . Arthritis   . Blind left eye   . Diabetes mellitus without complication ([place])   . Hypertension   . Optic neuritis, left     Surgeries: Procedure(s): ANTERIOR CERVICAL DECOMPRESSION/DISCECTOMY FUSION CERVICAL THREE- CERVICAL FOUR on 10/07/2019   Consultants:   Discharged Condition: Improved  Hospital Course: Brett Wallace is an 59 y.o. male who was admitted 10/07/2019 for operative treatment of Adjacent Segment degenerative disc disease C3-4 with C4 radiculopathy. Patient failed conservative treatments (please see the history and physical for the specifics) and had severe unremitting pain that affects sleep, daily activities and work/hobbies. After pre-op clearance, the patient was taken to the operating room on 10/07/2019 and underwent  Procedure(s): ANTERIOR CERVICAL DECOMPRESSION/DISCECTOMY FUSION CERVICAL THREE- CERVICAL FOUR.    Patient was given perioperative antibiotics:  Anti-infectives (From admission, onward)   Start     Dose/Rate Route Frequency Ordered Stop   10/07/19 1830  vancomycin (VANCOREADY) IVPB 1500 mg/300 mL     1,500 mg 150 mL/hr over 120 Minutes Intravenous  Once 10/07/19 1422 10/07/19 2245   10/07/19 0615  vancomycin (VANCOREADY) IVPB 1500 mg/300 mL     1,500 mg 150 mL/hr over 120 Minutes Intravenous To ShortStay Surgical 10/06/19 1318 10/07/19 0855       Patient was given sequential compression devices and early ambulation to prevent DVT.   Patient benefited maximally from hospital stay and there were no  complications. At the time of discharge, the patient was urinating/moving their bowels without difficulty, tolerating a regular diet, pain is controlled with oral pain medications and they have been cleared by PT/OT.   Recent vital signs: No data found.   Recent laboratory studies: No results for input(s): WBC, HGB, HCT, PLT, NA, K, CL, CO2, BUN, CREATININE, GLUCOSE, INR, CALCIUM in the last 72 hours.  Invalid input(s): PT, 2   Discharge Medications:   Allergies as of 10/08/2019      Reactions   Penicillins Rash, Other (See Comments)   Did it involve swelling of the face/tongue/throat, SOB, or low BP? No Did it involve sudden or severe rash/hives, skin peeling, or any reaction on the inside of your mouth or nose? No Did you need to seek medical attention at a hospital or doctor's office?    #  #  YES  #  #  (received aide from EMS) When did it last happen? More than 20 years ago If all above answers are "NO", may proceed with cephalosporin use.      Medication List    STOP taking these medications   aspirin EC 325 MG tablet   FISH OIL PO   multivitamin with minerals Tabs tablet   vitamin C with rose hips 500 MG tablet   Vitamin D (Ergocalciferol) 1.25 MG (50000 UNIT) Caps capsule Commonly known as: DRISDOL     TAKE these medications   allopurinol 100 MG tablet Commonly known as: ZYLOPRIM Take 100 mg by mouth daily.   amLODipine 5 MG tablet Commonly known as: NORVASC Take 5 mg by mouth daily.   furosemide 20 MG tablet Commonly known as: LASIX Take 20 mg by mouth  every other day.   glipiZIDE 10 MG tablet Commonly known as: GLUCOTROL Take 10 mg by mouth daily with breakfast.   insulin detemir 100 UNIT/ML injection Commonly known as: LEVEMIR Inject 30 Units into the skin at bedtime.   lisinopril 40 MG tablet Commonly known as: ZESTRIL Take 40 mg by mouth daily.   methocarbamol 500 MG tablet Commonly known as: Robaxin Take 1 tablet (500 mg total) by mouth  every 8 (eight) hours as needed for up to 5 days for muscle spasms.   ondansetron 4 MG tablet Commonly known as: Zofran Take 1 tablet (4 mg total) by mouth daily as needed for up to 5 days for nausea or vomiting.   oxyCODONE-acetaminophen 10-325 MG tablet Commonly known as: Percocet Take 1 tablet by mouth every 6 (six) hours as needed for up to 5 days for pain.   pravastatin 20 MG tablet Commonly known as: PRAVACHOL Take 20 mg by mouth daily.   traZODone 50 MG tablet Commonly known as: DESYREL Take 50 mg by mouth at bedtime as needed for sleep.       Diagnostic Studies: DG Chest 2 View  Result Date: 10/05/2019 CLINICAL DATA:  Preoperative EXAM: CHEST - 2 VIEW COMPARISON:  07/03/2015 FINDINGS: The heart size and mediastinal contours are within normal limits. Both lungs are clear. Disc degenerative disease of the thoracic spine. IMPRESSION: No acute abnormality of the lungs. Electronically Signed   By: Lauralyn Primes M.D.   On: 10/05/2019 16:51   DG Cervical Spine 2-3 Views  Result Date: 10/07/2019 CLINICAL DATA:  Intraoperative fluoroscopy, ACDF EXAM: CERVICAL SPINE - 2-3 VIEW; DG C-ARM 1-60 MIN COMPARISON:  None. FLUOROSCOPY TIME:  Fluoroscopy Time:  1:00 Radiation Exposure Index (if provided by the fluoroscopic device): 2.93 MA Number of Acquired Spot Images: 2 FINDINGS: Intraoperative fluoroscopic views of the cervical spine are provided for review demonstrating discectomy and fusion of C3-C4, C4-C5, and C6-C7. IMPRESSION: Intraoperative fluoroscopic assistance with views provided for review as above. Electronically Signed   By: Lauralyn Primes M.D.   On: 10/07/2019 10:09   DG C-Arm 1-60 Min  Result Date: 10/07/2019 CLINICAL DATA:  Intraoperative fluoroscopy, ACDF EXAM: CERVICAL SPINE - 2-3 VIEW; DG C-ARM 1-60 MIN COMPARISON:  None. FLUOROSCOPY TIME:  Fluoroscopy Time:  1:00 Radiation Exposure Index (if provided by the fluoroscopic device): 2.93 MA Number of Acquired Spot Images: 2  FINDINGS: Intraoperative fluoroscopic views of the cervical spine are provided for review demonstrating discectomy and fusion of C3-C4, C4-C5, and C6-C7. IMPRESSION: Intraoperative fluoroscopic assistance with views provided for review as above. Electronically Signed   By: Lauralyn Primes M.D.   On: 10/07/2019 10:09    Discharge Instructions    Incentive spirometry RT   Complete by: As directed       Follow-up Information    Venita Lick, MD Follow up.   Specialty: Orthopedic Surgery Contact information: [address] [place] [address] [place] [address] [phone]           Discharge Plan:  discharge to home  Disposition: stable    Signed: Leonette Monarch Shabreka Coulon for [facility] PA-C [facility] [phone] 10/11/2019, 8:19 AM

## 2021-10-20 IMAGING — RF DG C-ARM 1-60 MIN
1 series · 2 of 2 positions shown · non-contrast
Comparison: None.

CLINICAL DATA: Intraoperative fluoroscopy, ACDF

EXAM:
CERVICAL SPINE - 2-3 VIEW; DG C-ARM 1-60 MIN

[Series 1: run · 2 of 2 slices shown]
[im 1/2]
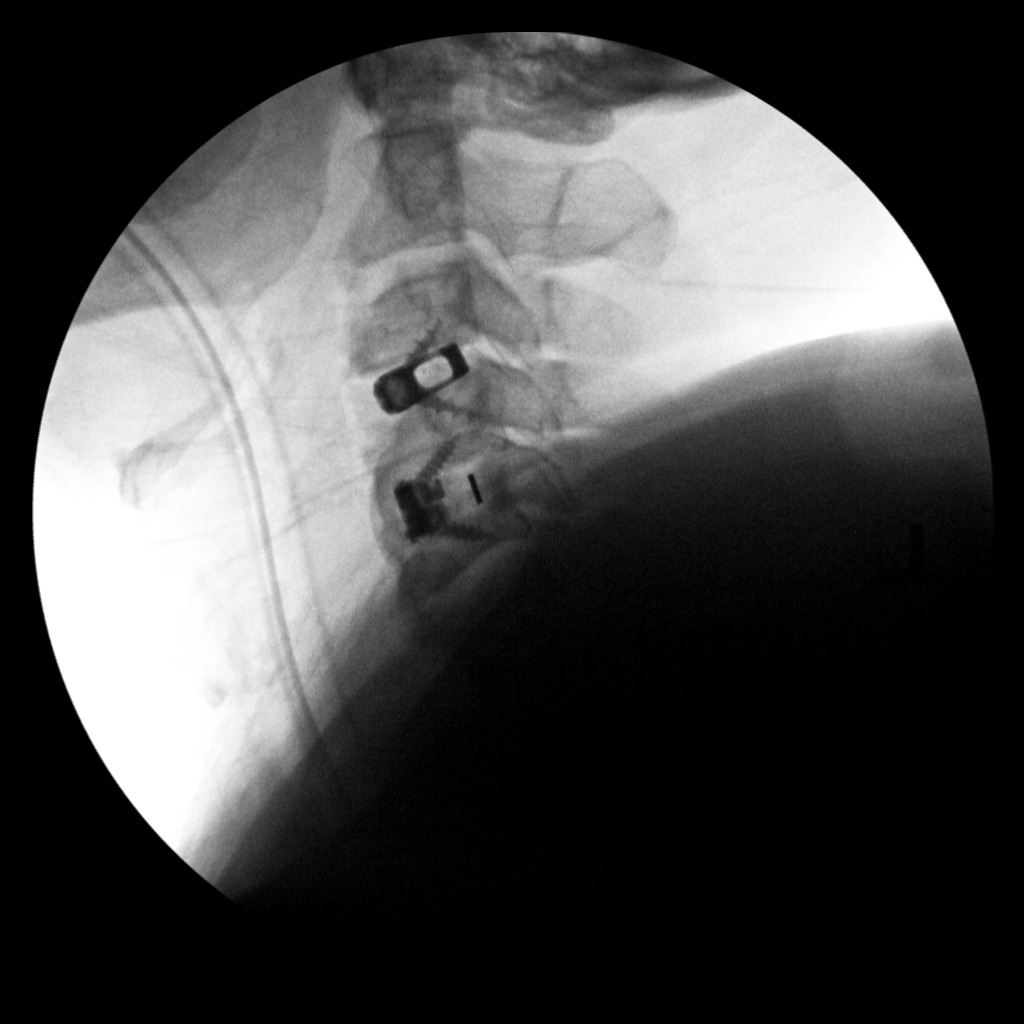
[im 2/2]
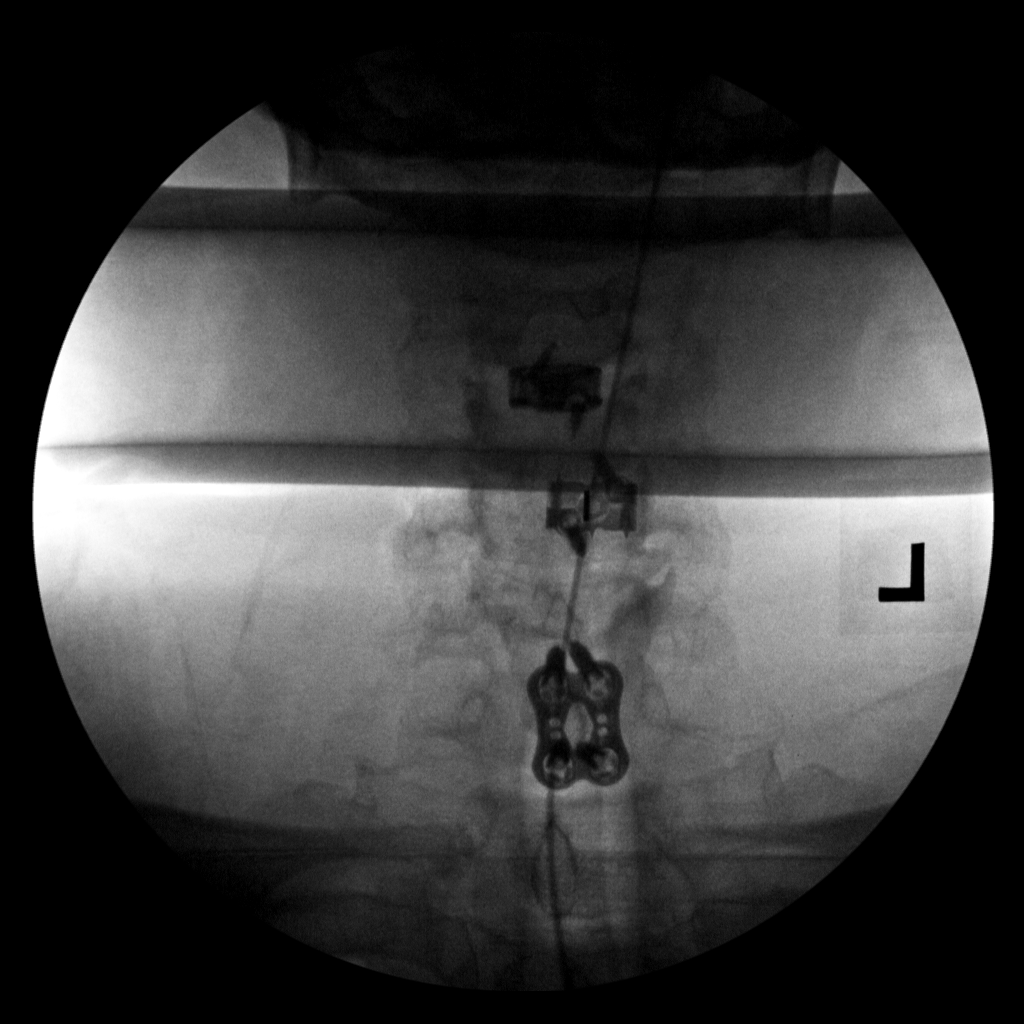

[2 of 2 positions shown; findings below may reference images not displayed]

FLUOROSCOPY TIME:  Fluoroscopy Time:  [DATE]

Radiation Exposure Index (if provided by the fluoroscopic device):
2.93 MA

Number of Acquired Spot Images: 2
FINDINGS: Intraoperative fluoroscopic views of the cervical spine are provided
for review demonstrating discectomy and fusion of C3-C4, C4-C5, and
C6-C7.
IMPRESSION: Intraoperative fluoroscopic assistance with views provided for
review as above.

## 2021-10-20 IMAGING — RF DG CERVICAL SPINE 2 OR 3 VIEWS
1 series · 2 of 2 positions shown · non-contrast
Comparison: None.

CLINICAL DATA: Intraoperative fluoroscopy, ACDF

EXAM:
CERVICAL SPINE - 2-3 VIEW; DG C-ARM 1-60 MIN

[Series 1: run · 2 of 2 slices shown]
[im 1/2]
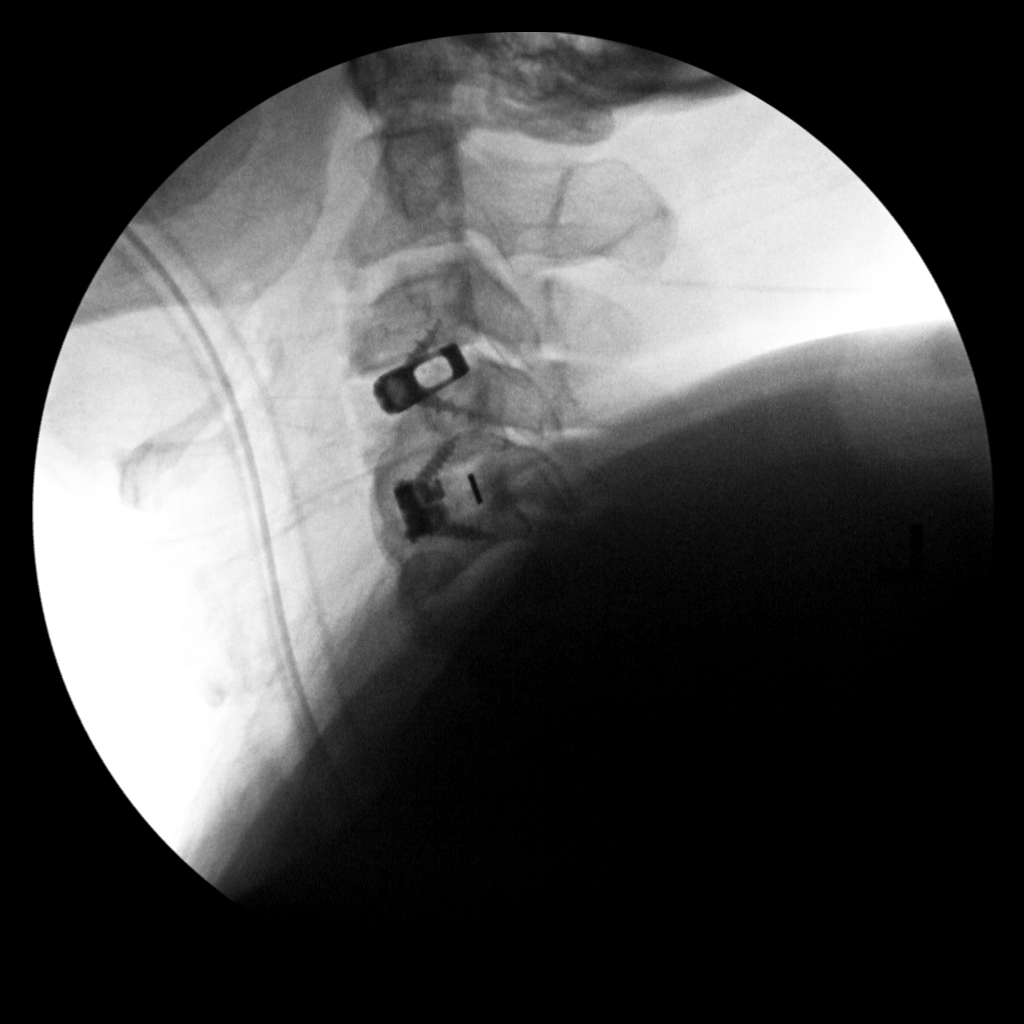
[im 2/2]
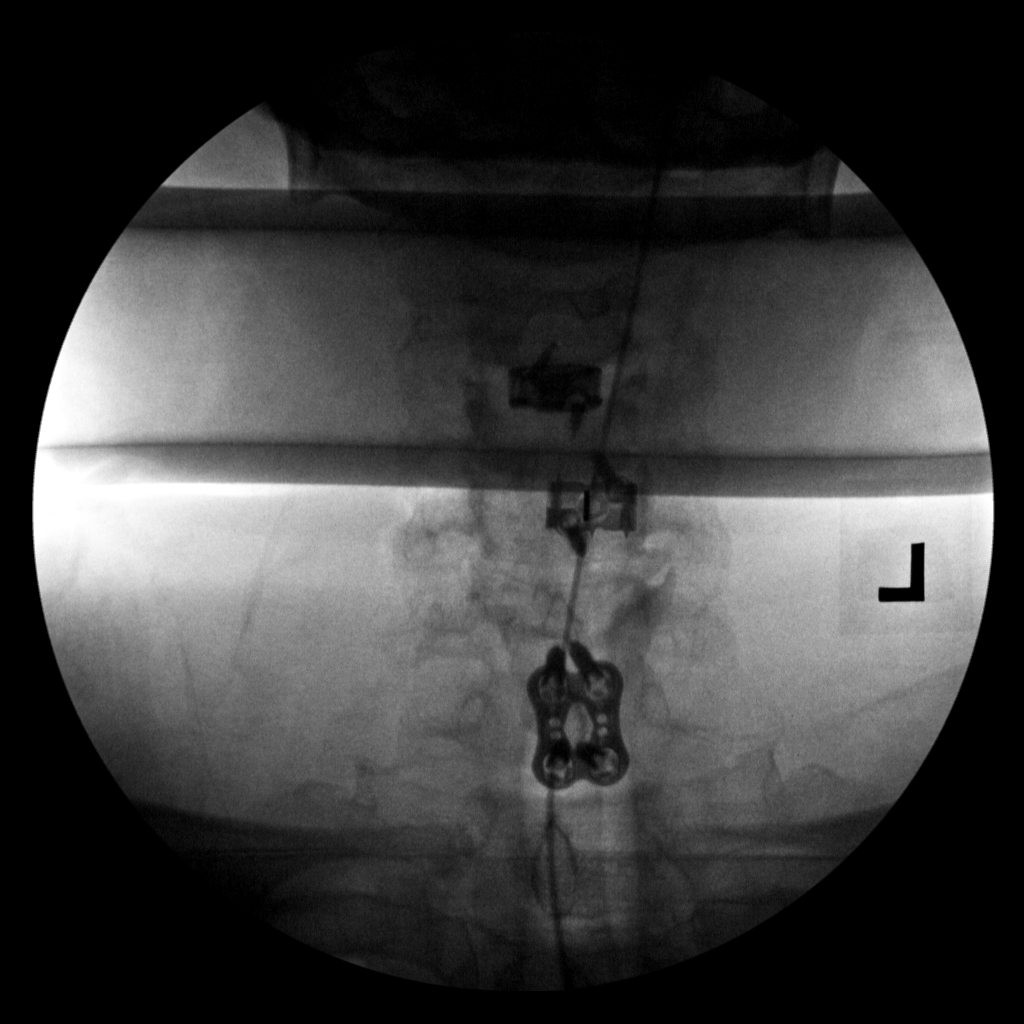

[2 of 2 positions shown; findings below may reference images not displayed]

FLUOROSCOPY TIME:  Fluoroscopy Time:  [DATE]

Radiation Exposure Index (if provided by the fluoroscopic device):
2.93 MA

Number of Acquired Spot Images: 2
FINDINGS: Intraoperative fluoroscopic views of the cervical spine are provided
for review demonstrating discectomy and fusion of C3-C4, C4-C5, and
C6-C7.
IMPRESSION: Intraoperative fluoroscopic assistance with views provided for
review as above.

## 2022-07-22 HISTORY — PX: BRAIN SURGERY: SHX531

## 2023-08-19 ENCOUNTER — Ambulatory Visit (HOSPITAL_COMMUNITY): Payer: Self-pay | Admitting: Orthopedic Surgery

## 2023-09-12 ENCOUNTER — Encounter (HOSPITAL_COMMUNITY): Payer: Self-pay | Admitting: Orthopedic Surgery

## 2023-09-12 ENCOUNTER — Other Ambulatory Visit: Payer: Self-pay

## 2023-09-12 NOTE — Progress Notes (Addendum)
SDW CALL  Patient was given pre-op instructions over the phone. The opportunity was given for the patient to ask questions. No further questions asked. Patient verbalized understanding of instructions given.   PCP - Victorino Dike Udom,MD Cardiologist - denies  PPM/ICD - denies Device Orders -  Rep Notified -   Chest x-ray - na EKG - DOS Stress Test - denies ECHO - denies Cardiac Cath - denies  Sleep Study - denies CPAP -   Fasting Blood Sugar - 130's Checks Blood Sugar usually has a [employer].removed it for surgery  Blood Thinner Instructions:na Aspirin Instructions:na  ERAS Protcol -clears until 1000 PRE-SURGERY Ensure or G2- no  COVID TEST- na   Anesthesia review: no  Patient denies shortness of breath, fever, cough and chest pain over the phone call    Surgical Instructions    Your procedure is scheduled on Monday February 24  Report to [facility] Main Entrance "A" at 1030 A.M., then check in with the Admitting office.  Call this number if you have problems the morning of surgery:  [phone]    Remember:  Do not eat after midnight the night before your surgery  You may drink clear liquids until 1000am the morning of your surgery.   Clear liquids allowed are: Water, Non-Citrus Juices (without pulp), Carbonated Beverages, Clear Tea, Black Coffee ONLY (NO MILK, CREAM OR POWDERED CREAMER of any kind), and Gatorade   Take these medicines the morning of surgery with A SIP OF WATER: Allopurinol,Norvasc,Pravachol. PRN-Percocet,Zanaflex.  As of today, STOP taking any Aspirin (unless otherwise instructed by your surgeon) Aleve, Naproxen, Ibuprofen, Motrin, Advil, Goody's, BC's, all herbal medications, fish oil, and all vitamins.  WHAT DO I DO ABOUT MY DIABETES MEDICATION?   Do not take oral diabetes medicines (pills) the morning of surgery. DO NOT TAKE GLIPIZIDE(GLUCOTROL)THE MORNING OF SURGERY.   Check your blood sugar the morning of your surgery when you  wake up and every 2 hours until you get to the Short Stay unit.  If your blood sugar is less than 70 mg/dL, you will need to treat for low blood sugar: Do not take insulin. Treat a low blood sugar (less than 70 mg/dL) with  cup of clear juice (cranberry or apple), 4 glucose tablets, OR glucose gel. Recheck blood sugar in 15 minutes after treatment (to make sure it is greater than 70 mg/dL). If your blood sugar is not greater than 70 mg/dL on recheck, call [phone] for further instructions. Report your blood sugar to the short stay nurse when you get to Short Stay.  [facility] is not responsible for any belongings or valuables. .   Do NOT Smoke (Tobacco/Vaping)  24 hours prior to your procedure  If you use a CPAP at night, you may bring your mask for your overnight stay.   Contacts, glasses, hearing aids, dentures or partials may not be worn into surgery, please bring cases for these belongings   Patients discharged the day of surgery will not be allowed to drive home, and someone needs to stay with them for 24 hours.   Special instructions:    Oral Hygiene is also important to reduce your risk of infection.  Remember - BRUSH YOUR TEETH THE MORNING OF SURGERY WITH YOUR REGULAR TOOTHPASTE   Day of Surgery:  Take a shower the day of or night before with antibacterial soap. Wear Clean/Comfortable clothing the morning of surgery Do not apply any deodorants/lotions.   Do not wear jewelry or makeup Do not  wear lotions, powders, perfumes/colognes, or deodorant. Do not shave 48 hours prior to surgery.  Men may shave face and neck. Do not bring valuables to the hospital. Do not wear nail polish, gel polish, artificial nails, or any other type of covering on natural nails (fingers and toes) If you have artificial nails or gel coating that need to be removed by a nail salon, please have this removed prior to surgery. Artificial nails or gel coating may interfere with anesthesia's ability  to adequately monitor your vital signs. Remember to brush your teeth WITH YOUR REGULAR TOOTHPASTE.

## 2023-09-14 NOTE — Anesthesia Preprocedure Evaluation (Signed)
 Anesthesia Evaluation  Patient identified by MRN, date of birth, ID band Patient awake    Reviewed: Allergy & Precautions, NPO status , Patient's Chart, lab work & pertinent test results  History of Anesthesia Complications Negative for: history of anesthetic complications  Airway Mallampati: III  TM Distance: >3 FB Neck ROM: Limited   Comment: Previous grade I view with Glidescope 4, easy mask Dental  (+) Dental Advisory Given, Edentulous Upper, Edentulous Lower   Pulmonary neg shortness of breath, neg sleep apnea, neg COPD, neg recent URI, Patient abstained from smoking., former smoker   Pulmonary exam normal breath sounds clear to auscultation       Cardiovascular hypertension (amlodipine, lisinopril), Pt. on medications (-) angina (-) Past MI, (-) Cardiac Stents and (-) CABG (-) dysrhythmias  Rhythm:Regular Rate:Normal  HLD   Neuro/Psych neg Seizures  Neuromuscular disease (cervical radiculopathy s/p ACDF)    GI/Hepatic negative GI ROS, Neg liver ROS,,,  Endo/Other  diabetes, Type 2, Oral Hypoglycemic Agents    Renal/GU negative Renal ROS     Musculoskeletal  (+) Arthritis ,    Abdominal  (+) + obese  Peds  Hematology negative hematology ROS (+)   Anesthesia Other Findings Blind in left eye  Reproductive/Obstetrics                             Anesthesia Physical Anesthesia Plan  ASA: 3  Anesthesia Plan: General   Post-op Pain Management: Tylenol PO (pre-op)*   Induction: Intravenous  PONV Risk Score and Plan: 2 and Ondansetron, Dexamethasone and Treatment may vary due to age or medical condition  Airway Management Planned: Oral ETT and Video Laryngoscope Planned  Additional Equipment:   Intra-op Plan:   Post-operative Plan: Extubation in OR  Informed Consent: I have reviewed the patients History and Physical, chart, labs and discussed the procedure including the risks,  benefits and alternatives for the proposed anesthesia with the patient or authorized representative who has indicated his/her understanding and acceptance.     Dental advisory given  Plan Discussed with: CRNA and Anesthesiologist  Anesthesia Plan Comments: (Risks of general anesthesia discussed including, but not limited to, sore throat, hoarse voice, chipped/damaged teeth, injury to vocal cords, nausea and vomiting, allergic reactions, lung infection, heart attack, stroke, and death. All questions answered. )        Anesthesia Quick Evaluation

## 2023-09-15 ENCOUNTER — Other Ambulatory Visit: Payer: Self-pay

## 2023-09-15 ENCOUNTER — Encounter (HOSPITAL_COMMUNITY): Payer: Self-pay | Admitting: Orthopedic Surgery

## 2023-09-15 ENCOUNTER — Encounter (HOSPITAL_COMMUNITY)
Admission: RE | Disposition: A | Discharge status: Home / Self Care | Payer: Self-pay | Source: Home / Self Care | Attending: Orthopedic Surgery

## 2023-09-15 ENCOUNTER — Ambulatory Visit (HOSPITAL_BASED_OUTPATIENT_CLINIC_OR_DEPARTMENT_OTHER): Payer: 59 | Admitting: Anesthesiology

## 2023-09-15 ENCOUNTER — Ambulatory Visit (HOSPITAL_COMMUNITY): Payer: 59

## 2023-09-15 ENCOUNTER — Ambulatory Visit (HOSPITAL_COMMUNITY): Payer: 59 | Admitting: Anesthesiology

## 2023-09-15 ENCOUNTER — Ambulatory Visit (HOSPITAL_COMMUNITY)
Admission: RE | Admit: 2023-09-15 | Discharge: 2023-09-17 | Disposition: A | Discharge status: Home / Self Care | Payer: 59 | Attending: Orthopedic Surgery | Admitting: Orthopedic Surgery

## 2023-09-15 DIAGNOSIS — H5462 Unqualified visual loss, left eye, normal vision right eye: Secondary | ICD-10-CM | POA: Insufficient documentation

## 2023-09-15 DIAGNOSIS — M4722 Other spondylosis with radiculopathy, cervical region: Secondary | ICD-10-CM | POA: Insufficient documentation

## 2023-09-15 DIAGNOSIS — E119 Type 2 diabetes mellitus without complications: Secondary | ICD-10-CM | POA: Diagnosis not present

## 2023-09-15 DIAGNOSIS — M50122 Cervical disc disorder at C5-C6 level with radiculopathy: Secondary | ICD-10-CM | POA: Insufficient documentation

## 2023-09-15 DIAGNOSIS — E669 Obesity, unspecified: Secondary | ICD-10-CM | POA: Insufficient documentation

## 2023-09-15 DIAGNOSIS — Z7982 Long term (current) use of aspirin: Secondary | ICD-10-CM | POA: Diagnosis not present

## 2023-09-15 DIAGNOSIS — Z7984 Long term (current) use of oral hypoglycemic drugs: Secondary | ICD-10-CM

## 2023-09-15 DIAGNOSIS — Z79899 Other long term (current) drug therapy: Secondary | ICD-10-CM | POA: Insufficient documentation

## 2023-09-15 DIAGNOSIS — M4802 Spinal stenosis, cervical region: Secondary | ICD-10-CM | POA: Diagnosis not present

## 2023-09-15 DIAGNOSIS — Z87891 Personal history of nicotine dependence: Secondary | ICD-10-CM | POA: Insufficient documentation

## 2023-09-15 DIAGNOSIS — M2578 Osteophyte, vertebrae: Secondary | ICD-10-CM | POA: Diagnosis not present

## 2023-09-15 DIAGNOSIS — I10 Essential (primary) hypertension: Secondary | ICD-10-CM | POA: Insufficient documentation

## 2023-09-15 DIAGNOSIS — M50322 Other cervical disc degeneration at C5-C6 level: Secondary | ICD-10-CM | POA: Diagnosis not present

## 2023-09-15 DIAGNOSIS — I110 Hypertensive heart disease with heart failure: Secondary | ICD-10-CM | POA: Diagnosis not present

## 2023-09-15 DIAGNOSIS — Z6835 Body mass index (BMI) 35.0-35.9, adult: Secondary | ICD-10-CM | POA: Insufficient documentation

## 2023-09-15 DIAGNOSIS — Z01818 Encounter for other preprocedural examination: Secondary | ICD-10-CM

## 2023-09-15 DIAGNOSIS — E785 Hyperlipidemia, unspecified: Secondary | ICD-10-CM | POA: Insufficient documentation

## 2023-09-15 DIAGNOSIS — M1990 Unspecified osteoarthritis, unspecified site: Secondary | ICD-10-CM | POA: Diagnosis not present

## 2023-09-15 DIAGNOSIS — M4322 Fusion of spine, cervical region: Secondary | ICD-10-CM | POA: Diagnosis present

## 2023-09-15 HISTORY — PX: ANTERIOR CERVICAL DECOMP/DISCECTOMY FUSION: SHX1161

## 2023-09-15 LAB — BASIC METABOLIC PANEL
Anion gap: 10 (ref 5–15)
BUN: 14 mg/dL (ref 6–20)
CO2: 24 mmol/L (ref 22–32)
Calcium: 9.2 mg/dL (ref 8.9–10.3)
Chloride: 103 mmol/L (ref 98–111)
Creatinine, Ser: 1.25 mg/dL — ABNORMAL HIGH (ref 0.61–1.24)
GFR, Estimated: >60 mL/min (ref >60)
Glucose, Bld: 129 mg/dL — ABNORMAL HIGH (ref 70–99)
Potassium: 3.6 mmol/L (ref 3.5–5.1)
Sodium: 137 mmol/L (ref 135–145)

## 2023-09-15 LAB — GLUCOSE, CAPILLARY
Glucose-Capillary: 125 mg/dL — ABNORMAL HIGH (ref 70–99)
Glucose-Capillary: 130 mg/dL — ABNORMAL HIGH (ref 70–99)
Glucose-Capillary: 125 mg/dL — ABNORMAL HIGH (ref 70–99)
Glucose-Capillary: 153 mg/dL — ABNORMAL HIGH (ref 70–99)

## 2023-09-15 LAB — CBC
HCT: 41 % (ref 39.0–52.0)
Hemoglobin: 13.5 g/dL (ref 13.0–17.0)
MCH: 30.1 pg (ref 26.0–34.0)
MCHC: 32.9 g/dL (ref 30.0–36.0)
MCV: 91.5 fL (ref 80.0–100.0)
Platelets: 233 10*3/uL (ref 150–400)
RBC: 4.48 MIL/uL (ref 4.22–5.81)
RDW: 12.2 % (ref 11.5–15.5)
WBC: 5.2 10*3/uL (ref 4.0–10.5)
nRBC: 0 % (ref 0.0–0.2)

## 2023-09-15 LAB — SURGICAL PCR SCREEN
MRSA, PCR: NEGATIVE
Staphylococcus aureus: NEGATIVE

## 2023-09-15 LAB — ABO/RH: ABO/RH(D): A POS

## 2023-09-15 SURGERY — ANTERIOR CERVICAL DECOMPRESSION/DISCECTOMY FUSION 1 LEVEL
Anesthesia: General

## 2023-09-15 MED ORDER — LISINOPRIL 20 MG PO TABS: 40.0000 mg | ORAL_TABLET | Freq: Every morning | ORAL | Status: DC

## 2023-09-15 MED ORDER — LIDOCAINE 2% (20 MG/ML) 5 ML SYRINGE
INTRAMUSCULAR | Status: DC | PRN
  Administered 2023-09-15: 2.5 mg via INTRAVENOUS

## 2023-09-15 MED ORDER — ACETAMINOPHEN 325 MG PO TABS: 650.0000 mg | ORAL_TABLET | ORAL | Status: DC | PRN

## 2023-09-15 MED ORDER — MAGNESIUM CITRATE PO SOLN: 1.0000 | Freq: Once | ORAL | Status: DC | PRN

## 2023-09-15 MED ORDER — SODIUM CHLORIDE 0.9 % IV SOLN: 250.0000 mL | INTRAVENOUS | Status: AC

## 2023-09-15 MED ORDER — OXYCODONE HCL 5 MG PO TABS
10.0000 mg | ORAL_TABLET | ORAL | Status: DC | PRN
  Administered 2023-09-15 – 2023-09-17 (×9): 10 mg via ORAL
  Filled 2023-09-15 (×9): qty 2

## 2023-09-15 MED ORDER — CEFAZOLIN SODIUM-DEXTROSE 2-4 GM/100ML-% IV SOLN
2.0000 g | INTRAVENOUS | Status: AC
  Administered 2023-09-15: 2 g via INTRAVENOUS
  Filled 2023-09-15: qty 100

## 2023-09-15 MED ORDER — HYDROMORPHONE HCL 1 MG/ML IJ SOLN: 1.0000 mg | INTRAMUSCULAR | Status: AC | PRN

## 2023-09-15 MED ORDER — SODIUM CHLORIDE 0.9% FLUSH: 3.0000 mL | INTRAVENOUS | Status: DC | PRN

## 2023-09-15 MED ORDER — METHOCARBAMOL 1000 MG/10ML IJ SOLN: 500.0000 mg | Freq: Four times a day (QID) | INTRAMUSCULAR | Status: DC | PRN

## 2023-09-15 MED ORDER — POLYETHYLENE GLYCOL 3350 17 G PO PACK: 17.0000 g | PACK | Freq: Every day | ORAL | Status: DC | PRN

## 2023-09-15 MED ORDER — PROPOFOL 10 MG/ML IV BOLUS
INTRAVENOUS | Status: DC | PRN
  Administered 2023-09-15: 150 mg via INTRAVENOUS

## 2023-09-15 MED ORDER — INSULIN ASPART 100 UNIT/ML IJ SOLN
0.0000 [IU] | Freq: Three times a day (TID) | INTRAMUSCULAR | Status: DC
  Administered 2023-09-15: 2 [IU] via SUBCUTANEOUS
  Administered 2023-09-16: 3 [IU] via SUBCUTANEOUS
  Administered 2023-09-16 – 2023-09-17 (×2): 2 [IU] via SUBCUTANEOUS

## 2023-09-15 MED ORDER — OXYCODONE HCL 5 MG PO TABS: 5.0000 mg | ORAL_TABLET | Freq: Once | ORAL | Status: AC | PRN

## 2023-09-15 MED ORDER — CEFAZOLIN SODIUM-DEXTROSE 1-4 GM/50ML-% IV SOLN
1.0000 g | Freq: Three times a day (TID) | INTRAVENOUS | Status: AC
  Administered 2023-09-15 – 2023-09-16 (×2): 1 g via INTRAVENOUS
  Filled 2023-09-15 (×2): qty 50

## 2023-09-15 MED ORDER — MIDAZOLAM HCL 2 MG/2ML IJ SOLN
INTRAMUSCULAR | Status: DC | PRN
Start: 2023-09-15 — End: 2023-09-15
  Administered 2023-09-15: 2 mg via INTRAVENOUS

## 2023-09-15 MED ORDER — THROMBIN (RECOMBINANT) 5000 UNITS EX SOLR
CUTANEOUS | Status: AC
  Filled 2023-09-15: qty 5000

## 2023-09-15 MED ORDER — THROMBIN 20000 UNITS EX SOLR
CUTANEOUS | Status: DC | PRN
  Administered 2023-09-15: 20 mL via TOPICAL

## 2023-09-15 MED ORDER — DEXMEDETOMIDINE HCL IN NACL 80 MCG/20ML IV SOLN
INTRAVENOUS | Status: DC | PRN
  Administered 2023-09-15: 8 ug via INTRAVENOUS
  Administered 2023-09-15: 4 ug via INTRAVENOUS

## 2023-09-15 MED ORDER — METHOCARBAMOL 500 MG PO TABS
500.0000 mg | ORAL_TABLET | Freq: Three times a day (TID) | ORAL | 0 refills | Status: AC | PRN
Start: 2023-09-15 — End: 2023-09-20

## 2023-09-15 MED ORDER — SODIUM CHLORIDE 0.9% FLUSH
3.0000 mL | Freq: Two times a day (BID) | INTRAVENOUS | Status: DC
  Administered 2023-09-15: 3 mL via INTRAVENOUS

## 2023-09-15 MED ORDER — SUGAMMADEX SODIUM 200 MG/2ML IV SOLN
INTRAVENOUS | Status: DC | PRN
  Administered 2023-09-15: 200 mg via INTRAVENOUS

## 2023-09-15 MED ORDER — INSULIN ASPART 100 UNIT/ML IJ SOLN: 0.0000 [IU] | Freq: Every day | INTRAMUSCULAR | Status: DC

## 2023-09-15 MED ORDER — MIDAZOLAM HCL 2 MG/2ML IJ SOLN
INTRAMUSCULAR | Status: AC
  Filled 2023-09-15: qty 2

## 2023-09-15 MED ORDER — ROCURONIUM BROMIDE 10 MG/ML (PF) SYRINGE
PREFILLED_SYRINGE | INTRAVENOUS | Status: DC | PRN
  Administered 2023-09-15: 50 mg via INTRAVENOUS
  Administered 2023-09-15: 20 mg via INTRAVENOUS
  Administered 2023-09-15: 10 mg via INTRAVENOUS

## 2023-09-15 MED ORDER — ONDANSETRON HCL 4 MG PO TABS: 4.0000 mg | ORAL_TABLET | Freq: Three times a day (TID) | ORAL | 0 refills | Status: AC | PRN

## 2023-09-15 MED ORDER — TRANEXAMIC ACID-NACL 1000-0.7 MG/100ML-% IV SOLN
1000.0000 mg | INTRAVENOUS | Status: AC
  Administered 2023-09-15: 1000 mg via INTRAVENOUS
  Filled 2023-09-15: qty 100

## 2023-09-15 MED ORDER — INSULIN ASPART 100 UNIT/ML IJ SOLN: 0.0000 [IU] | INTRAMUSCULAR | Status: DC | PRN

## 2023-09-15 MED ORDER — PHENYLEPHRINE HCL-NACL 20-0.9 MG/250ML-% IV SOLN
INTRAVENOUS | Status: DC | PRN
  Administered 2023-09-15 (×2): 160 ug via INTRAVENOUS

## 2023-09-15 MED ORDER — ONDANSETRON HCL 4 MG PO TABS: 4.0000 mg | ORAL_TABLET | Freq: Four times a day (QID) | ORAL | Status: DC | PRN

## 2023-09-15 MED ORDER — ONDANSETRON HCL 4 MG/2ML IJ SOLN
INTRAMUSCULAR | Status: DC | PRN
  Administered 2023-09-15: 2 mg via INTRAVENOUS

## 2023-09-15 MED ORDER — METHOCARBAMOL 500 MG PO TABS
500.0000 mg | ORAL_TABLET | Freq: Four times a day (QID) | ORAL | Status: DC | PRN
  Administered 2023-09-15 – 2023-09-16 (×2): 500 mg via ORAL
  Filled 2023-09-15 (×2): qty 1

## 2023-09-15 MED ORDER — OXYCODONE-ACETAMINOPHEN 10-325 MG PO TABS: 1.0000 | ORAL_TABLET | Freq: Four times a day (QID) | ORAL | 0 refills | Status: AC | PRN

## 2023-09-15 MED ORDER — ACETAMINOPHEN 500 MG PO TABS
1000.0000 mg | ORAL_TABLET | Freq: Once | ORAL | Status: AC
  Administered 2023-09-15: 1000 mg via ORAL
  Filled 2023-09-15: qty 2

## 2023-09-15 MED ORDER — OXYCODONE HCL 5 MG PO TABS: 5.0000 mg | ORAL_TABLET | ORAL | Status: DC | PRN

## 2023-09-15 MED ORDER — PRAVASTATIN SODIUM 40 MG PO TABS
40.0000 mg | ORAL_TABLET | Freq: Every day | ORAL | Status: DC
  Administered 2023-09-16: 40 mg via ORAL
  Filled 2023-09-15: qty 1

## 2023-09-15 MED ORDER — BUPIVACAINE-EPINEPHRINE 0.25% -1:200000 IJ SOLN
INTRAMUSCULAR | Status: DC | PRN
  Administered 2023-09-15: 30 mL

## 2023-09-15 MED ORDER — BUPIVACAINE-EPINEPHRINE (PF) 0.25% -1:200000 IJ SOLN
INTRAMUSCULAR | Status: AC
  Filled 2023-09-15: qty 30

## 2023-09-15 MED ORDER — HYDROMORPHONE HCL 1 MG/ML IJ SOLN
INTRAMUSCULAR | Status: AC
Start: 2023-09-15 — End: 2023-09-16
  Filled 2023-09-15: qty 1

## 2023-09-15 MED ORDER — ALLOPURINOL 100 MG PO TABS
100.0000 mg | ORAL_TABLET | Freq: Every morning | ORAL | Status: DC
  Administered 2023-09-16 – 2023-09-17 (×2): 100 mg via ORAL
  Filled 2023-09-15 (×2): qty 1

## 2023-09-15 MED ORDER — AMLODIPINE BESYLATE 5 MG PO TABS
5.0000 mg | ORAL_TABLET | Freq: Every morning | ORAL | Status: DC
  Administered 2023-09-16 – 2023-09-17 (×2): 5 mg via ORAL
  Filled 2023-09-15 (×2): qty 1

## 2023-09-15 MED ORDER — LISINOPRIL 20 MG PO TABS
40.0000 mg | ORAL_TABLET | Freq: Every morning | ORAL | Status: DC
  Administered 2023-09-15 – 2023-09-17 (×3): 40 mg via ORAL
  Filled 2023-09-15 (×3): qty 2

## 2023-09-15 MED ORDER — EPHEDRINE SULFATE-NACL 50-0.9 MG/10ML-% IV SOSY
PREFILLED_SYRINGE | INTRAVENOUS | Status: DC | PRN
  Administered 2023-09-15: 10 mg via INTRAVENOUS

## 2023-09-15 MED ORDER — OXYCODONE HCL 5 MG/5ML PO SOLN
ORAL | Status: AC
  Filled 2023-09-15: qty 5

## 2023-09-15 MED ORDER — LACTATED RINGERS IV SOLN: INTRAVENOUS | Status: AC

## 2023-09-15 MED ORDER — OXYCODONE HCL 5 MG/5ML PO SOLN
5.0000 mg | Freq: Once | ORAL | Status: AC | PRN
  Administered 2023-09-15: 5 mg via ORAL

## 2023-09-15 MED ORDER — PHENOL 1.4 % MT LIQD: 1.0000 | OROMUCOSAL | Status: DC | PRN

## 2023-09-15 MED ORDER — GLIPIZIDE 10 MG PO TABS
10.0000 mg | ORAL_TABLET | Freq: Two times a day (BID) | ORAL | Status: DC
  Administered 2023-09-15 – 2023-09-17 (×4): 10 mg via ORAL
  Filled 2023-09-15: qty 2
  Filled 2023-09-15: qty 1
  Filled 2023-09-15 (×2): qty 2
  Filled 2023-09-15 (×3): qty 1

## 2023-09-15 MED ORDER — ACETAMINOPHEN 650 MG RE SUPP: 650.0000 mg | RECTAL | Status: DC | PRN

## 2023-09-15 MED ORDER — LACTATED RINGERS IV SOLN: INTRAVENOUS | Status: DC

## 2023-09-15 MED ORDER — ONDANSETRON HCL 4 MG/2ML IJ SOLN: 4.0000 mg | Freq: Four times a day (QID) | INTRAMUSCULAR | Status: DC | PRN

## 2023-09-15 MED ORDER — MENTHOL 3 MG MT LOZG
1.0000 | LOZENGE | OROMUCOSAL | Status: DC | PRN
  Filled 2023-09-15 (×2): qty 9

## 2023-09-15 MED ORDER — FENTANYL CITRATE (PF) 250 MCG/5ML IJ SOLN
INTRAMUSCULAR | Status: DC | PRN
  Administered 2023-09-15: 100 ug via INTRAVENOUS
  Administered 2023-09-15: 150 ug via INTRAVENOUS

## 2023-09-15 MED ORDER — AMISULPRIDE (ANTIEMETIC) 5 MG/2ML IV SOLN: 10.0000 mg | Freq: Once | INTRAVENOUS | Status: DC | PRN

## 2023-09-15 MED ORDER — FENTANYL CITRATE (PF) 250 MCG/5ML IJ SOLN
INTRAMUSCULAR | Status: AC
  Filled 2023-09-15: qty 5

## 2023-09-15 MED ORDER — HYDROMORPHONE HCL 1 MG/ML IJ SOLN
0.2500 mg | INTRAMUSCULAR | Status: DC | PRN
  Administered 2023-09-15 (×2): 0.25 mg via INTRAVENOUS

## 2023-09-15 MED ORDER — CHLORHEXIDINE GLUCONATE 0.12 % MT SOLN
OROMUCOSAL | Status: AC
  Administered 2023-09-15: 15 mL
  Filled 2023-09-15: qty 15

## 2023-09-15 SURGICAL SUPPLY — 56 items
BAG COUNTER SPONGE SURGICOUNT (BAG) ×2 IMPLANT
BLADE CLIPPER SURG (BLADE) IMPLANT
CABLE BIPOLOR RESECTION CORD (MISCELLANEOUS) ×2 IMPLANT
CANISTER SUCT 3000ML PPV (MISCELLANEOUS) ×2 IMPLANT
CLSR STERI-STRIP ANTIMIC 1/2X4 (GAUZE/BANDAGES/DRESSINGS) ×2 IMPLANT
COVER MAYO STAND STRL (DRAPES) ×6 IMPLANT
COVER SURGICAL LIGHT HANDLE (MISCELLANEOUS) ×4 IMPLANT
DRAIN CHANNEL 15F RND FF W/TCR (WOUND CARE) IMPLANT
DRAPE C-ARM 42X72 X-RAY (DRAPES) ×2 IMPLANT
DRAPE POUCH INSTRU U-SHP 10X18 (DRAPES) ×2 IMPLANT
DRAPE SURG 17X23 STRL (DRAPES) ×2 IMPLANT
DRAPE U-SHAPE 47X51 STRL (DRAPES) ×2 IMPLANT
DRSG OPSITE POSTOP 3X4 (GAUZE/BANDAGES/DRESSINGS) ×2 IMPLANT
DURAPREP 26ML APPLICATOR (WOUND CARE) ×2 IMPLANT
ELECT COATED BLADE 2.86 ST (ELECTRODE) ×2 IMPLANT
ELECT PENCIL ROCKER SW 15FT (MISCELLANEOUS) ×2 IMPLANT
ELECT REM PT RETURN 9FT ADLT (ELECTROSURGICAL) ×1 IMPLANT
ELECTRODE REM PT RTRN 9FT ADLT (ELECTROSURGICAL) ×2 IMPLANT
GLOVE BIO SURGEON STRL SZ 6.5 (GLOVE) ×2 IMPLANT
GLOVE BIOGEL PI IND STRL 6.5 (GLOVE) ×2 IMPLANT
GLOVE BIOGEL PI IND STRL 8.5 (GLOVE) ×2 IMPLANT
GLOVE SS BIOGEL STRL SZ 8.5 (GLOVE) ×2 IMPLANT
GOWN STRL REUS W/ TWL LRG LVL3 (GOWN DISPOSABLE) ×2 IMPLANT
GOWN STRL REUS W/TWL 2XL LVL3 (GOWN DISPOSABLE) ×2 IMPLANT
KIT BASIN OR (CUSTOM PROCEDURE TRAY) ×2 IMPLANT
KIT TURNOVER KIT B (KITS) ×2 IMPLANT
NDL HYPO 22X1.5 SAFETY MO (MISCELLANEOUS) ×2 IMPLANT
NDL SPNL 18GX3.5 QUINCKE PK (NEEDLE) ×2 IMPLANT
NEEDLE HYPO 22X1.5 SAFETY MO (MISCELLANEOUS) ×1 IMPLANT
NEEDLE SPNL 18GX3.5 QUINCKE PK (NEEDLE) ×1 IMPLANT
NS IRRIG 1000ML POUR BTL (IV SOLUTION) ×2 IMPLANT
PACK ORTHO CERVICAL (CUSTOM PROCEDURE TRAY) ×2 IMPLANT
PACK UNIVERSAL I (CUSTOM PROCEDURE TRAY) ×2 IMPLANT
PAD ARMBOARD 7.5X6 YLW CONV (MISCELLANEOUS) ×4 IMPLANT
PATTIES SURGICAL .25X.25 (GAUZE/BANDAGES/DRESSINGS) IMPLANT
PIN DISTRATION 14MM (PIN) IMPLANT
POSITIONER HEAD DONUT 9IN (MISCELLANEOUS) ×2 IMPLANT
PUTTY BONE DBX 2.5 MIS (Bone Implant) IMPLANT
PUTTY DBX 1CC (Putty) ×1 IMPLANT
PUTTY DBX 1CC DEPUY (Putty) IMPLANT
RESTRAINT LIMB HOLDER UNIV (RESTRAINTS) ×2 IMPLANT
SCREW SELF DRILL 4.2MM14MM (Screw) IMPLANT
SPACER REVEL-S 14X16 7D (Cage) IMPLANT
SPONGE INTESTINAL PEANUT (DISPOSABLE) ×2 IMPLANT
SPONGE SURGIFOAM ABS GEL SZ50 (HEMOSTASIS) ×2 IMPLANT
SURGIFLO W/THROMBIN 8M KIT (HEMOSTASIS) IMPLANT
SUT BONE WAX W31G (SUTURE) ×2 IMPLANT
SUT MNCRL AB 3-0 PS2 27 (SUTURE) ×2 IMPLANT
SUT VIC AB 2-0 CT1 18 (SUTURE) ×2 IMPLANT
SYR BULB IRRIG 60ML STRL (SYRINGE) ×2 IMPLANT
SYR CONTROL 10ML LL (SYRINGE) ×2 IMPLANT
TAPE CLOTH 4X10 WHT NS (GAUZE/BANDAGES/DRESSINGS) ×2 IMPLANT
TAPE UMBILICAL 1/8X30 (MISCELLANEOUS) ×2 IMPLANT
TOWEL GREEN STERILE (TOWEL DISPOSABLE) ×2 IMPLANT
TOWEL GREEN STERILE FF (TOWEL DISPOSABLE) ×2 IMPLANT
WATER STERILE IRR 1000ML POUR (IV SOLUTION) ×2 IMPLANT

## 2023-09-15 NOTE — Transfer of Care (Signed)
 Immediate Anesthesia Transfer of Care Note  Patient: Brett Gerold Sr.  Procedure(s) Performed: ANTERIOR CERVICAL DECOMPRESSION/DISCECTOMY FUSION 1 LEVEL C5-6  Patient Location: PACU  Anesthesia Type:General  Level of Consciousness: awake, alert , and oriented  Airway & Oxygen Therapy: Patient Spontanous Breathing and Patient connected to nasal cannula oxygen  Post-op Assessment: Report given to RN and Post -op Vital signs reviewed and stable  Post vital signs: Reviewed and stable  Last Vitals:  Vitals Value Taken Time  BP 160/83 09/15/23 1521  Temp    Pulse 88 09/15/23 1525  Resp 17 09/15/23 1525  SpO2 93 % 09/15/23 1525  Vitals shown include unfiled device data.  Last Pain:  Vitals:   09/15/23 1053  TempSrc:   PainSc: 0-No pain         Complications: No notable events documented.

## 2023-09-15 NOTE — CV Procedure (Signed)
 OPERATIVE REPORT   DATE OF SURGERY: 09/15/2023   PATIENT NAME:  Brett FRANEK Sr. 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  Prior ACDF C3-5 and C6-7.   POST-OPERATIVE DIAGNOSIS: Same   PROCEDURE:   ACDF C5-6   SURGEON:  Venita Lick, MD   PHYSICIAN ASSISTANT: Luther Bradley   ANESTHESIA:   General   EBL: 50 ml    Complications: None   Implants: Revel-S cervical cage:  6 to 9 mm lordotic expandable cage.   Graft: DBX mix and DBX   BRIEF HISTORY: [name] is a 63 y.o. male who presents with ongoing significant neck and radicular arm pain.  Attempts at conservative management had failed to alleviate his symptoms.  Patient had foraminal stenosis and adjacent segment cervical disease.  I was a result we elected to move forward with a revision surgery.  All appropriate risks, benefits, and alternatives were discussed and consent was obtained.   PROCEDURE DETAILS: Patient was brought into the operating room and was properly positioned on the operating room table.  After induction with general anesthesia the patient was endotracheally intubated.  A timeout was taken to confirm all important data: including patient, procedure, and the level. Teds, SCD's were applied.    Anterior cervical spine was prepped and draped in a standard fashion.  Using fluoroscopy I marked out the C5-6 disc space.  The incision site was infiltrated with quarter percent Marcaine with epinephrine.  I elected to proceed with a right sided approach as there was less palpable scar tissue on the side.  Transverse incision was made and sharp dissection was carried out down to the platysma.  The platysma was sharply excised and identified the sternocleidomastoid.  I began dissecting along the medial border of the sternocleidomastoid in the avascular plane.  This is a standard Smith-Robinson approach to the cervical spine.  I  ultimately was able to identify the carotid artery.  I protected this with my finger.  I could now palpate the anterior aspect of the cervical spine.  I swept the trach and esophagus to the left and protected with a hand-held retractor.  The disc space was identified and marked with a needle.  X-ray was taken confirming I was at the appropriate level.  I removed the needle and marked the disc space with the Bovie.  Using bipolar cautery I mobilized the longus coli muscles bilaterally from the midportion of C5 to the midportion of the C6.  The anterior traction spurs were isolated and resected with Leksell rongeur.   Annulotomy was performed a 15 blade scalpel and I began removing all of the disc material with pituitary rongeurs and curettes.  I continued to work all the way posteriorly until I could see the posterior annulus.  I then used a fine nerve hook to dissect through the annulus.  Then used a 1 mm Kerrison rongeur to resect the posterior osteophyte from the vertebral bodies of C5 and C6 and remove the posterior annulus.  This allowed me to undercut the uncovertebral joint bilaterally to improve the foraminal space.  Using live fluoroscopy I confirmed that parallel endplate distraction using a lamina spreader.  I also confirmed satisfactory decompression by able to visualize my nerve hook under the vertebral body of C5 and C6 and under the uncovertebral joints bilaterally.   Once the decompression/discectomy proved satisfactory I rasped the endplates to bleeding subchondral bone.  I elected to  use the medium implant as it provided the best overall fit.  The implant was packed with DBX mix and gently inserted to the appropriate depth.  It was countersunk.  I then expanded it to a final height of 7.5 mm.  I then backfilled the cage with DBX.  214 mm locking screws were then placed through the cage and into the vertebral body: one in the body of C6 and the other in C5.  Both screws had excellent purchase.   The locking device was engaged per Engineer, agricultural.  I removed the retractors and irrigated the wound copiously normal saline.  I made sure that hemostasis using bipolar cautery and Floseal.  After final irrigation and confirmation of hemostasis the trach and esophagus were returned to midline and final x-rays were taken.  The new implant had satisfactory overall position in both the AP and lateral planes.   The platysma was reapproximated with interrupted 2-0 Vicryl sutures on the skin with a 3-0 Monocryl.  Steri-Strips and a dry dressing were applied as well as a [place] collar.  The patient was ultimately extubated transferred the PACU without incident.  The end of the case all needle sponge counts were correct.  There were no adverse intraoperative events.       Venita Lick, MD 09/15/2023 2:58 PM '

## 2023-09-15 NOTE — Brief Op Note (Signed)
 09/15/2023  3:09 PM  PATIENT:  Brett Gerold Sr.  63 y.o. male  PRE-OPERATIVE DIAGNOSIS:  Adjacent segment degenerative disc disease with radiculopathy C5-6  POST-OPERATIVE DIAGNOSIS:  Adjacent segment degenerative disc disease with radiculopathy C5-6  PROCEDURE:  Procedure(s) with comments: ANTERIOR CERVICAL DECOMPRESSION/DISCECTOMY FUSION 1 LEVEL C5-6 (N/A) - 3 C-Bed  SURGEON:  Surgeons and Role:    Venita Lick, MD - Primary  PHYSICIAN ASSISTANT:  Luther Bradley, PA   ANESTHESIA:   general  EBL:  50 mL   BLOOD ADMINISTERED:none  DRAINS: none   LOCAL MEDICATIONS USED:  MARCAINE     SPECIMEN:  No Specimen  DISPOSITION OF SPECIMEN:  N/A  COUNTS:  YES  TOURNIQUET:  * No tourniquets in log *  DICTATION: .Dragon Dictation  PLAN OF CARE: Admit for overnight observation  PATIENT DISPOSITION:  PACU - hemodynamically stable.

## 2023-09-15 NOTE — Anesthesia Postprocedure Evaluation (Signed)
 Anesthesia Post Note  Patient: Brett KILNER Sr.  Procedure(s) Performed: ANTERIOR CERVICAL DECOMPRESSION/DISCECTOMY FUSION 1 LEVEL C5-6     Patient location during evaluation: PACU Anesthesia Type: General Level of consciousness: awake Pain management: pain level controlled Vital Signs Assessment: post-procedure vital signs reviewed and stable Respiratory status: spontaneous breathing, nonlabored ventilation and respiratory function stable Cardiovascular status: blood pressure returned to baseline and stable Postop Assessment: no apparent nausea or vomiting Anesthetic complications: no   No notable events documented.  Last Vitals:  Vitals:   09/15/23 1726 09/15/23 1919  BP: (!) 162/91 (!) 173/76  Pulse: 81 78  Resp: 18 18  Temp: 36.9 C 37.2 C  SpO2: 93% 96%    Last Pain:  Vitals:   09/15/23 1919  TempSrc: Oral  PainSc:                  Carolie Mcilrath P Catherine Cubero

## 2023-09-15 NOTE — H&P (Addendum)
 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  He has had a prior cervical operations ultimately culminating in an ACDF C3-5 and at C6-7.  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  Attempts at conservative management had failed to alleviate his symptoms and so we have elected to move forward with surgery.  All appropriate risks, benefits, alternatives to surgery were discussed and consent was obtained.  Past Medical History:  Diagnosis Date   Arthritis    Blind left eye    Diabetes mellitus without complication (HCC)    Hypertension    Optic neuritis, left     Allergies  Allergen Reactions   Penicillins Rash and Other (See Comments)    Did it involve swelling of the face/tongue/throat, SOB, or low BP? No Did it involve sudden or severe rash/hives, skin peeling, or any reaction on the inside of your mouth or nose? No Did you need to seek medical attention at a hospital or doctor's office?    #  #  YES  #  #  (received aide from EMS) When did it last happen? More than 20 years ago      If all above answers are "NO", may proceed with cephalosporin use.     No current facility-administered medications on file prior to encounter.   Current Outpatient Medications on File Prior to Encounter  Medication Sig Dispense Refill   allopurinol (ZYLOPRIM) 100 MG tablet Take 100 mg by mouth in the morning.     amLODipine (NORVASC) 5 MG tablet Take 5 mg by mouth in the morning.     aspirin EC 81 MG tablet Take 81 mg by mouth at bedtime. Swallow whole.     Cholecalciferol (VITAMIN D-3 PO) Take 1 tablet by mouth once a week. (OTC)     glipiZIDE (GLUCOTROL) 10 MG tablet Take 10 mg by mouth in the morning and at bedtime.     lisinopril (ZESTRIL) 40 MG tablet Take 40 mg by mouth in the morning.     [START ON 09/23/2023] Multiple Vitamin  (MULTIVITAMIN WITH MINERALS) TABS tablet Take 1 tablet by mouth in the morning.     oxyCODONE-acetaminophen (PERCOCET) 10-325 MG tablet Take 1 tablet by mouth every 8 (eight) hours as needed for pain.     pravastatin (PRAVACHOL) 40 MG tablet Take 40 mg by mouth daily.     tiZANidine (ZANAFLEX) 2 MG tablet Take 2 mg by mouth every 8 (eight) hours as needed for muscle spasms.      Physical Exam: Vitals:   09/15/23 1045  BP: (!) 168/70  Pulse: 73  Resp: 20  Temp: 98.6 F (37 C)  SpO2: 93%   Body mass index is 35.36 kg/m. Clinical exam: Brett Wallace is a pleasant individual, who appears younger than their stated age.   He is alert and orientated 3.   No shortness of breath, chest pain.   Abdomen is soft and non-tender, negative loss of bowel and bladder control, no rebound tenderness.   Negative: skin lesions abrasions contusions   Peripheral pulses: 2+ peripheral pulses bilaterally. LE compartments are: Soft and nontender.   Gait pattern: Altered gait pattern due to chronic neck and low back pain   Assistive devices: Cane   Neuro: Positive numbness and dysesthesias and neuropathic pain in the left C6 dermatome. Negative Spurling sign, negative Hoffman test. 5/5 motor strength  in the upper extremity. 1+ deep tendon reflexes.   Musculoskeletal: Significant neck pain with range of motion and palpation. Well-healed surgical scar from prior fusion surgery C3-5 and C6-7.   Imaging: X-rays of the cervical spine demonstrate a solid fusion with no hardware complications. He has progression in his degenerative disease at the C5-6 level.   Cervical MRI: completed on 02/26/2023. Small area of cord signal change at the C4-5 level. No compressive pathology noted. Status post ACDF C3-4, C4-5, and C6-7. No significant significant residual stenosis. There is moderate to severe right foraminal stenosis at C3-4. Patient has moderate to severe right foraminal stenosis at C5-6.  Cervical MRI: Completed  on 08/30/2023.  No significant change from prior MRI on 02/26/2023.   Patient notes temporary significant improvement in his pain and quality of life following the recent C6 selective nerve root block    A/P: Unfortunately the pain has become debilitating despite physical therapy, activity modification, and medications.  Arn underwent a C6 selective nerve root block and had temporary marked improvement in his pain and quality of life.   At this point I believe the intervening segment at C5-6 is his primary pain generator. Despite attempts at conservative management this pain is still severe. He is elected to move forward with an ACDF at C5-6. I have gone over the surgical procedure in great detail and all of his questions were addressed   Risks and benefits of surgery were discussed with the patient. These include: Infection, bleeding, death, stroke, paralysis, ongoing or worse pain, need for additional surgery, nonunion, leak of spinal fluid, adjacent segment degeneration requiring additional fusion surgery. Pseudoarthrosis (nonunion)requiring supplemental posterior fixation. Throat pain, swallowing difficulties, hoarseness or change in voice.  We will plan on using an expandable 0 profile cervical cage from [employer]. This will decrease the potential need to remove the existing ACDF plate at Z6-1. Bone graft: DBX mix.

## 2023-09-15 NOTE — Discharge Instructions (Signed)
 Today you will be discharged from the hospital.  The purpose of the following handout is to help guide you over the next 2 weeks.  First and foremost, be sure you have a follow up appointment with Dr. Shon Baton 2 weeks from the time of your surgery to have your sutures removed.  Please call [facility] [phone] to schedule or confirm this appointment.      Brace You do not have to wear the collar while lying in bed or sitting in a high-backed chair, eating, sleeping or showering.  Other than these instances, you must wear the brace.  You may NOT wear the collar while driving a vehicle (see driving restrictions below).  It is advisable that you wear the collar in public places or while traveling in a car as a passenger.  Dr. Shon Baton will discuss further use of the collar at your 2 week postop visit.  Wound Care You may SHOWER 5 days from the date of surgery.  Shower directly over the steri-strips.  DO NOT scrub or submerge (bath tub, swimming pool, hot tub, etc.) the area.  Pat to dry following your shower.  There is no need for additional dressings other than the steri-strips.  Allow the steri-strips to fall off on their own.  Once the strips have fallen off, you may leave the area undressed.  DO NOT apply lotion/cream/ointment to the area.  The wound must remain dry at all times other than while showering.  Dr. Shon Baton or his staff will remove your stiches at your first postop visit and give you additional instructions regarding wound care at that time.   Activity NO DRIVING FOR 2 WEEKS.  No lifting over 5 pounds (approximately a gallon of milk).  No bending, stooping, squatting or twisting.  No overhead activities.  We encourage you to walk (short distances and often throughout the day) as you can tolerate.  A good rule of thumb is to get up and move once or twice every hour.  You may go up and down stairs carefully.  As you continue to recover, Dr. Shon Baton will address and  adjust restrictions to your activities until no further restrictions are needed.  However, until your first postop visit, when Dr. Shon Baton can assess your recovery, you are to follow these instructions.  At the end of this document is a tentative outline of activities for up to 1 year.       Medication You will be discharged from the hospital with medication for pain, spasm, nausea and constipation.  You will be given enough medication to last until your first postop visit in 2 weeks.  Medications WILL NOT BE REFILLED EARLY; therefore, you are to take the medications only as directed.  If you have been given multiple prescriptions, please leave them with your pharmacy.  They can keep them on file for when you need them.  Medications that are lost or stolen WILL NOT be replaced.  We will address the need for continuing certain medications on an individual basis during your postop visit.  We ask that you avoid over the counter anti-inflammatory medications (Advil, Aleve, Motrin) for 3 months.    What you can expect following neck surgery... It is not uncommon to experience a sore throat or difficulty swallowing following neck surgery.  Cold liquids and soft foods are helpful in soothing this discomfort.  There is no specific diet that you are to follow after surgery, however, there are a  few things you should keep in mind to avoid unneeded discomfort.  Take small bites and eat slowly.  Chew your food thoroughly before swallowing.   It is not uncommon to experience incisional soreness or pain in the back of the neck, shoulders or between the shoulder blades.  These symptoms will slowly begin to resolve as you continue to recover, however, they can last for a few weeks.    It is not uncommon to experience INTERMITTENT arm pain following surgery.  This pain can mimic the arm pain you had prior to surgery.  As long as the pain resolves on its own and is not constant, there is no need to become alarmed.    When To Call If you experience fever >101F, loss of bowel or bladder control, painful swelling in the lower extremities, constant (unresolving) arm pain.  If you experience any of these symptoms, please call [facility] [phone].  What's Next As mentioned earlier, you will follow up with Dr. Shon Baton in 2 weeks.  At that time, we will likely remove your stitches and discuss additional aspects of your recovery.                   ACTIVITY GUIDELINES ANTERIOR CERVICAL DISECTOMY AND FUSION  Activity Discharge 2 weeks 6 weeks 3 months 6 months 1 year  Shower 5 days        Submerge the wound  no no yes     Walking outdoors yes       Lifting 5 lbs yes       Climbing stairs yes       Cooking yes       Car rides (less than 30 minutes) yes       Car rides (greater than 30 minutes) no varies yes     Air travel no varies yes     Short outings [employer], visits, etc...) yes       School no no yes     Driving a car no no varies yes    Light upper extremity exercises no no varies yes    Stationary bike no no yes     Swimming (no diving) no no no varies yes   Vacuuming, laundry, mopping no no no varies yes   Biking outdoors no no no no varies yes  Light jogging no no no varies yes   Low impact aerobics no no no varies yes   Non-contact sports (tennis, golf) no no no varies yes   Hunting (no tree climbing) no no no varies yes   Dancing (non-gymnastics) no no no varies yes   Down-hill skiing (experienced skier) no no no no yes   Down-hill skiing (novice) no no no no yes   Cross-country skiing no no no no yes   Horseback riding (noncompetitive)  no no no no yes   Horseback riding (competitive) no no no no varies yes  Gardening/landscaping no no no varies yes   House repairs no no no varies varies yes  Lifting up to 50 lbs no no no no varies yes

## 2023-09-15 NOTE — Anesthesia Procedure Notes (Signed)
 Procedure Name: Intubation Date/Time: 09/15/2023 12:22 PM  Performed by: Hali Marry, CRNAPre-anesthesia Checklist: Patient identified, Emergency Drugs available, Suction available and Patient being monitored Patient Re-evaluated:Patient Re-evaluated prior to induction Oxygen Delivery Method: Circle system utilized Preoxygenation: Pre-oxygenation with 100% oxygen Induction Type: IV induction Ventilation: Mask ventilation without difficulty Laryngoscope Size: Glidescope and 4 Grade View: Grade I Tube type: Oral Number of attempts: 1 Airway Equipment and Method: Stylet and Oral airway Placement Confirmation: ETT inserted through vocal cords under direct vision, positive ETCO2 and breath sounds checked- equal and bilateral Tube secured with: Tape Dental Injury: Teeth and Oropharynx as per pre-operative assessment

## 2023-09-16 DIAGNOSIS — M4802 Spinal stenosis, cervical region: Secondary | ICD-10-CM | POA: Diagnosis not present

## 2023-09-16 LAB — GLUCOSE, CAPILLARY
Glucose-Capillary: 164 mg/dL — ABNORMAL HIGH (ref 70–99)
Glucose-Capillary: 140 mg/dL — ABNORMAL HIGH (ref 70–99)
Glucose-Capillary: 227 mg/dL — ABNORMAL HIGH (ref 70–99)
Glucose-Capillary: 160 mg/dL — ABNORMAL HIGH (ref 70–99)

## 2023-09-16 LAB — HEMOGLOBIN A1C
Hgb A1c MFr Bld: 6.1 % — ABNORMAL HIGH (ref 4.8–5.6)
Mean Plasma Glucose: 128 mg/dL

## 2023-09-16 NOTE — Progress Notes (Signed)
 Additional PT Note:  SATURATION QUALIFICATIONS: (This note is used to comply with regulatory documentation for home oxygen)  Patient Saturations on Room Air at Rest = 93%  Patient Saturations on Room Air while Ambulating = 85-86%  Patient Saturations on 2 Liters of oxygen while Ambulating = 95%  Please briefly explain why patient needs home oxygen: Pt with symptomatic O2 desaturation with ambulation, became light headed. Improved with application of 2L O2.   Lyanne Co, PT  Acute Rehab Services Secure chat preferred Office [phone]

## 2023-09-16 NOTE — Plan of Care (Signed)

## 2023-09-16 NOTE — Progress Notes (Addendum)
    Subjective: Procedure(s) (LRB): ANTERIOR CERVICAL DECOMPRESSION/DISCECTOMY FUSION 1 LEVEL C5-6 (N/A) 1 Day Post-Op  Patient reports pain as 7 on 0-10 scale.  Reports decreased arm pain reports incisional neck pain   Positive void Negative bowel movement Negative flatus Negative chest pain or shortness of breath  Objective: Vital signs in last 24 hours: Temp:  [98 F (36.7 C)-99.5 F (37.5 C)] 99.5 F (37.5 C) (02/25 0434) Pulse Rate:  [73-90] 89 (02/25 0434) Resp:  [14-20] 18 (02/25 0434) BP: (141-177)/(67-91) 177/70 (02/25 0434) SpO2:  [87 %-96 %] 95 % (02/25 0434) Weight:  [113.4 kg] 113.4 kg (02/24 1045)  Intake/Output from previous day: 02/24 0701 - 02/25 0700 In: 1620 [P.O.:720; I.V.:900] Out: 50 [Blood:50]  Labs: Recent Labs    09/15/23 1050  WBC 5.2  RBC 4.48  HCT 41.0  PLT 233   Recent Labs    09/15/23 1050  NA 137  K 3.6  CL 103  CO2 24  BUN 14  CREATININE 1.25*  GLUCOSE 129*  CALCIUM 9.2   No results for input(s): "LABPT", "INR" in the last 72 hours.  Physical Exam: Neurologically intact Neurovascular intact Incision: dressing C/D/I Body mass index is 35.36 kg/m.  Assessment/Plan: -Patient stable  -xrays N/a -Mobilization with physical therapy, pending clearance; patient is ambulating independently -Encourage incentive spirometry -Continue care  - Still awaiting patient to have positive flatus or BM - Patient does state that radicular arm pain is improved    Plan for discharge tomorrow d/c to home pending PT clearance, positive flatus, and adequate pain control with oral medications  Amber Evlyn Courier [facility] [phone]   Overall patient is doing exceptionally well.  Preoperative neuropathic arm pain is improved.  Patient sats do decrease when he ambulates but this is a chronic issue.  Will plan on discharging tomorrow.  Will continue with physical therapy.

## 2023-09-16 NOTE — Evaluation (Signed)
 Physical Therapy Evaluation Patient Details Name: Brett PLOEGER Sr. MRN: [id number] DOB: [birth year] Today's Date: 09/16/2023  History of Present Illness  Pt is 63 yo male who presents on 09/15/23 for ACDF C5-6. Has past h/o ACDF C3-5 and C6-7. Other PMH: L eye blindness, DM, OA, HTN  Clinical Impression  Pt admitted with above diagnosis. Pt from home alone but has aide 7 days/wk, 3 hrs/day and is planning to go to sister's home initially for supervision. Pt ambulated 200', felt light headed at 100' and needed to turn back to room. Factor that may have contributed is pt's O2 sats dropped to 86% on RA. Returned to 93% on 2 L O2. Pt notes that he has O2 at home from last surgery. Without AD pt has flexed trunk and reaches for stable surfaces, with HHA to simulate cane use pt stand with much more erect posture and is more steady. Pt has cane at home.  Pt currently with functional limitations due to the deficits listed below (see PT Problem List). Pt will benefit from acute skilled PT to increase their independence and safety with mobility to allow discharge.           If plan is discharge home, recommend the following: A little help with walking and/or transfers;A little help with bathing/dressing/bathroom;Assist for transportation;Help with stairs or ramp for entrance   Can travel by private vehicle        Equipment Recommendations None recommended by PT  Recommendations for Other Services  OT consult    Functional Status Assessment Patient has had a recent decline in their functional status and demonstrates the ability to make significant improvements in function in a reasonable and predictable amount of time.     Precautions / Restrictions Precautions Precautions: Cervical Precaution Booklet Issued: Yes (comment) Recall of Precautions/Restrictions: Intact Precaution/Restrictions Comments: reviewed proper posture and positioning Required Braces or Orthoses: Cervical Brace Cervical  Brace: Hard collar Restrictions Weight Bearing Restrictions Per Provider Order: No      Mobility  Bed Mobility               General bed mobility comments: pt received in chair    Transfers Overall transfer level: Needs assistance Equipment used: None Transfers: Sit to/from Stand Sit to Stand: Supervision           General transfer comment: pt steady with sit>stand    Ambulation/Gait Ambulation/Gait assistance: Min assist Gait Distance (Feet): 200 Feet (pt feeling mildly light headed after 100') Assistive device: 1 person hand held assist Gait Pattern/deviations: Step-through pattern, Trunk flexed Gait velocity: decreased Gait velocity interpretation: 1.31 - 2.62 ft/sec, indicative of limited community ambulator   General Gait Details: without support pt maintaining trunk flexion and reaching for wall. Pt given therapist's hand to simulate SPC and instructed to put wt through and he was able to maintain more erect posture and was more steady without reaching out for stable surfaces  Stairs            Wheelchair Mobility     Tilt Bed    Modified Rankin (Stroke Patients Only)       Balance Overall balance assessment: Mild deficits observed, not formally tested                                           Pertinent Vitals/Pain Pain Assessment Pain Assessment: Faces Faces Pain Scale: Hurts  little more Pain Location: neck Pain Descriptors / Indicators: Sore Pain Intervention(s): Limited activity within patient's tolerance, Monitored during session    Home Living Family/patient expects to be discharged to:: Private residence Living Arrangements: Alone Available Help at Discharge: Family;Personal care attendant Type of Home: Apartment Home Access: Elevator       Home Layout: One level Home Equipment: Cane - single point Additional Comments: pt has an aide 7 days/wk for 3 hrs. Plans to go home to sister's house initially. She  has 3 STE    Prior Function Prior Level of Function : Needs assist;Driving             Mobility Comments: ambulates with cane, independent ADLs Comments: aide assists with showering and home mgmt     Extremity/Trunk Assessment   Upper Extremity Assessment Upper Extremity Assessment: Defer to OT evaluation    Lower Extremity Assessment Lower Extremity Assessment: Generalized weakness (has low back stenosis as well)    Cervical / Trunk Assessment Cervical / Trunk Assessment: Neck Surgery  Communication   Communication Communication:  (soft voice)    Cognition Arousal: Lethargic (reports did not sleep last night) Behavior During Therapy: WFL for tasks assessed/performed   PT - Cognitive impairments: No apparent impairments                         Following commands: Intact       Cueing Cueing Techniques: Verbal cues     General Comments General comments (skin integrity, edema, etc.): SPO2 on 2L O2 before ambulation 95%. Ambulated on RA, SPO2 86% at end with HR 114 bpm. Pt reports he does have O2 at home from last surgery.    Exercises     Assessment/Plan    PT Assessment Patient needs continued PT services  PT Problem List Decreased balance;Decreased mobility;Decreased activity tolerance;Cardiopulmonary status limiting activity;Pain;Decreased strength       PT Treatment Interventions DME instruction;Gait training;Stair training;Functional mobility training;Therapeutic activities;Therapeutic exercise;Balance training;Patient/family education    PT Goals (Current goals can be found in the Care Plan section)  Acute Rehab PT Goals Patient Stated Goal: return home PT Goal Formulation: With patient Time For Goal Achievement: 09/23/23 Potential to Achieve Goals: Good    Frequency Min 1X/week     Co-evaluation               AM-PAC PT "6 Clicks" Mobility  Outcome Measure Help needed turning from your back to your side while in a flat bed  without using bedrails?: A Little Help needed moving from lying on your back to sitting on the side of a flat bed without using bedrails?: A Little Help needed moving to and from a bed to a chair (including a wheelchair)?: A Little Help needed standing up from a chair using your arms (e.g., wheelchair or bedside chair)?: A Little Help needed to walk in hospital room?: A Little Help needed climbing 3-5 steps with a railing? : A Little 6 Click Score: 18    End of Session Equipment Utilized During Treatment: Oxygen Activity Tolerance: Patient tolerated treatment well Patient left: in chair;with call bell/phone within reach Nurse Communication: Mobility status PT Visit Diagnosis: Unsteadiness on feet (R26.81);Difficulty in walking, not elsewhere classified (R26.2);Pain Pain - part of body:  (neck)    Time: 9528-4132 PT Time Calculation (min) (ACUTE ONLY): 14 min   Charges:   PT Evaluation $PT Eval Low Complexity: 1 Low   PT General Charges $$ ACUTE PT VISIT:  1 Visit         Lyanne Co, PT  Acute Rehab Services Secure chat preferred Office [phone]   Elyse Hsu 09/16/2023, 9:53 AM

## 2023-09-17 ENCOUNTER — Encounter (HOSPITAL_COMMUNITY): Payer: Self-pay | Admitting: Orthopedic Surgery

## 2023-09-17 DIAGNOSIS — M4802 Spinal stenosis, cervical region: Secondary | ICD-10-CM | POA: Diagnosis not present

## 2023-09-17 LAB — BPAM RBC
Blood Product Expiration Date: 202503102359
Blood Product Expiration Date: 202503102359
Unit Type and Rh: 6200
Unit Type and Rh: 6200

## 2023-09-17 LAB — TYPE AND SCREEN
ABO/RH(D): A POS
Antibody Screen: POSITIVE
Donor AG Type: NEGATIVE
Donor AG Type: NEGATIVE
PT AG Type: NEGATIVE
Unit division: 0
Unit division: 0

## 2023-09-17 LAB — GLUCOSE, CAPILLARY: Glucose-Capillary: 139 mg/dL — ABNORMAL HIGH (ref 70–99)

## 2023-09-17 NOTE — Progress Notes (Signed)
Patient alert and oriented, mae's well, voiding adequate amount of urine, swallowing without difficulty, no c/o pain at time of discharge. Patient discharged home with family. Script and discharged instructions given to patient. Patient and family stated understanding of instructions given. Patient has an appointment with Dr. Brooks  

## 2023-09-17 NOTE — Discharge Summary (Addendum)
 Patient ID: Brett Gerold Sr. MRN: [id number] DOB/AGE: 1961/01/03 63 y.o.  Admit date: 09/15/2023 Discharge date: 09/17/2023  Admission Diagnoses:  Principal Problem:   Fusion of spine, cervical region   Discharge Diagnoses:  Principal Problem:   Fusion of spine, cervical region  status post Procedure(s): ANTERIOR CERVICAL DECOMPRESSION/DISCECTOMY FUSION 1 LEVEL C5-6  Past Medical History:  Diagnosis Date   Arthritis    Blind left eye    Diabetes mellitus without complication (HCC)    Hypertension    Optic neuritis, left     Surgeries: Procedure(s): ANTERIOR CERVICAL DECOMPRESSION/DISCECTOMY FUSION 1 LEVEL C5-6 on 09/15/2023   Consultants:   Discharged Condition: Improved  Hospital Course: Brett Edell. is an 63 y.o. male who was admitted 09/15/2023 for operative treatment of Fusion of spine, cervical region. Patient failed conservative treatments (please see the history and physical for the specifics) and had severe unremitting pain that affects sleep, daily activities and work/hobbies. After pre-op clearance, the patient was taken to the operating room on 09/15/2023 and underwent  Procedure(s): ANTERIOR CERVICAL DECOMPRESSION/DISCECTOMY FUSION 1 LEVEL C5-6.    Patient was given perioperative antibiotics:  Anti-infectives (From admission, onward)    Start     Dose/Rate Route Frequency Ordered Stop   09/15/23 2000  ceFAZolin (ANCEF) IVPB 1 g/50 mL premix        1 g 100 mL/hr over 30 Minutes Intravenous Every 8 hours 09/15/23 1658 09/16/23 1633   09/15/23 1030  ceFAZolin (ANCEF) IVPB 2g/100 mL premix        2 g 200 mL/hr over 30 Minutes Intravenous 30 min pre-op 09/15/23 1030 09/15/23 1235        Patient was given sequential compression devices and early ambulation to prevent DVT.   Patient benefited maximally from hospital stay and there were no complications. At the time of discharge, the patient was urinating/moving their bowels without difficulty,  tolerating a regular diet, pain is controlled with oral pain medications and they have been cleared by PT/OT.   Recent vital signs: Patient Vitals for the past 24 hrs:  BP Temp Temp src Pulse Resp SpO2  09/17/23 0410 (!) 159/72 98.8 F (37.1 C) Oral 76 20 99 %  09/16/23 2241 (!) 161/80 99.4 F (37.4 C) Oral 87 18 100 %  09/16/23 1941 (!) 151/72 99.7 F (37.6 C) Oral 88 18 99 %  09/16/23 1600 (!) 150/79 99 F (37.2 C) Oral 86 18 97 %  09/16/23 0757 (!) 160/71 99 F (37.2 C) Oral 96 18 92 %     Recent laboratory studies:  Recent Labs    09/15/23 1050  WBC 5.2  HGB 13.5  HCT 41.0  PLT 233  NA 137  K 3.6  CL 103  CO2 24  BUN 14  CREATININE 1.25*  GLUCOSE 129*  CALCIUM 9.2     Discharge Medications:   Allergies as of 09/17/2023       Reactions   Penicillins Rash, Other (See Comments)   Did it involve swelling of the face/tongue/throat, SOB, or low BP? No Did it involve sudden or severe rash/hives, skin peeling, or any reaction on the inside of your mouth or nose? No Did you need to seek medical attention at a hospital or doctor's office?    #  #  YES  #  #  (received aide from EMS) When did it last happen? More than 20 years ago      If all above answers are "NO", may  proceed with cephalosporin use.        Medication List     STOP taking these medications    aspirin EC 81 MG tablet   tiZANidine 2 MG tablet Commonly known as: ZANAFLEX       TAKE these medications    allopurinol 100 MG tablet Commonly known as: ZYLOPRIM Take 100 mg by mouth in the morning.   amLODipine 5 MG tablet Commonly known as: NORVASC Take 5 mg by mouth in the morning.   glipiZIDE 10 MG tablet Commonly known as: GLUCOTROL Take 10 mg by mouth in the morning and at bedtime.   lisinopril 40 MG tablet Commonly known as: ZESTRIL Take 40 mg by mouth in the morning.   methocarbamol 500 MG tablet Commonly known as: ROBAXIN Take 1 tablet (500 mg total) by mouth every 8 (eight)  hours as needed for up to 5 days for muscle spasms.   multivitamin with minerals Tabs tablet Take 1 tablet by mouth in the morning. Start taking on: September 23, 2023   ondansetron 4 MG tablet Commonly known as: Zofran Take 1 tablet (4 mg total) by mouth every 8 (eight) hours as needed for nausea or vomiting.   oxyCODONE-acetaminophen 10-325 MG tablet Commonly known as: Percocet Take 1 tablet by mouth every 6 (six) hours as needed for up to 5 days for pain. What changed: when to take this   pravastatin 40 MG tablet Commonly known as: PRAVACHOL Take 40 mg by mouth daily.   VITAMIN D-3 PO Take 1 tablet by mouth once a week. (OTC)        Diagnostic Studies: DG Cervical Spine 2 or 3 views Result Date: 09/15/2023 CLINICAL DATA:  Elective surgery EXAM: CERVICAL SPINE - 2-3 VIEW COMPARISON:  Cervical spine CT 07/22/2019 FINDINGS: Three intraoperative fluoroscopic views of the cervical spine were submitted. Disc spacer was placed at C3-C4 and C6-C7. Interbody fusion at C4-C5 again noted. Anterior fusion plate at W0-J8 again seen. Alignment is anatomic as visualized. Fluoroscopy time: 1 minute 14 seconds. Fluoroscopy dose: 22.53 micro gray. IMPRESSION: Disc spacer was placed at C3-C4 and C6-C7. Electronically Signed   By: Darliss Cheney M.D.   On: 09/15/2023 18:55   DG C-Arm 1-60 Min-No Report Result Date: 09/15/2023 Fluoroscopy was utilized by the requesting physician.  No radiographic interpretation.   DG C-Arm 1-60 Min-No Report Result Date: 09/15/2023 Fluoroscopy was utilized by the requesting physician.  No radiographic interpretation.   DG C-Arm 1-60 Min-No Report Result Date: 09/15/2023 Fluoroscopy was utilized by the requesting physician.  No radiographic interpretation.       Follow-up Information     Venita Lick, MD. Schedule an appointment as soon as possible for a visit in 2 week(s).   Specialty: Orthopedic Surgery Why: If symptoms worsen, For suture removal, For wound  re-check Contact information: [address] [place] [address] [phone]         [facility] [place], [place] Follow up.   Why: The home health agency [facility]) will contact you for the first home visit. Contact information: [address] [place] [address] [phone]                 Discharge Plan:  discharge to home today   Disposition:   Chaston is a very pleasant 63 year old male who is POD 2 from a revision anterior cervical decompression/discectomy fusion C5-C6. Overall, the surgery was tolerated well by the  patient without complications. The patient states that his incision pain is mild and that his pre-operative radicular arm pain has significantly improved. Patient was given a hard cervical collar for after surgery.The patients hospital course has been uncomplicated. The patient has been ambulating independently, positive void, positive flatus, and has been cleared by PT. Patient has been tolerating oral intake of liquids and solids well. Dressing is c/d/I. All questions by the patient were welcomed and answered. Patient will be d/c to home today.   Patient instructed to not shower until POD 5 Patient will follow- up in 2 weeks with [place] in the clinic     Signed: Luther Bradley for Dr. Venita Lick [facility] [phone] 09/17/2023, 7:17 AM  Agree with above Patient stable No complications or issues during hospital stay

## 2023-09-17 NOTE — TOC Transition Note (Signed)
 Transition of Care [facility]) - Discharge Note   Patient Details  Name: Brett Wallace Sr. MRN: [id number] Date of Birth: 11-Jun-1961  Transition of Care [facility]) CM/SW Contact:  Kermit Balo, RN Phone Number: 09/17/2023, 8:52 AM   Clinical Narrative:     Pt is discharging home with home health services through [facility]. Information on the AVS. [facility] will contact him for the first home visit. Shower seat with back ordered through [employer] and will be delivered to his home.  Pt has transportation home.  Final next level of care: Home w Home Health Services Barriers to Discharge: No Barriers Identified   Patient Goals and CMS Choice     Choice offered to / list presented to : Patient      Discharge Placement                       Discharge Plan and Services Additional resources added to the After Visit Summary for                  DME Arranged: Shower stool DME Agency: [employer] Date DME Agency Contacted: 09/17/23   Representative spoke with at DME Agency: Zack HH Arranged: OT, PT HH Agency: [facility] Date [facility] Agency Contacted: 09/16/23   Representative spoke with at [facility] Agency: Marylene Land  Social Drivers of Health (SDOH) Interventions SDOH Screenings   Food Insecurity: Low Risk  (05/13/2023)   Received from [facility]  Housing: Low Risk  (05/13/2023)   Received from [facility]  Transportation Needs: No Transportation Needs (05/13/2023)   Received from [facility]  Utilities: Low Risk  (05/13/2023)   Received from [facility]  Social Connections: Unknown (12/04/2021)   Received from [facility]  Tobacco Use: Medium Risk (09/15/2023)     Readmission Risk Interventions     No data to display

## 2023-09-17 NOTE — Evaluation (Signed)
 Occupational Therapy Evaluation and Discharge Patient Details Name: Brett HOPWOOD Sr. MRN: [id number] DOB: 30-Apr-1961 Today's Date: 09/17/2023   History of Present Illness   Pt is 63 yo male who presents on 09/15/23 for ACDF C5-6. Has past h/o ACDF C3-5 and C6-7. Other PMH: L eye blindness, DM, OA, HTN     Clinical Impressions This 63 yo male admitted and underwent above presents to acute OT with all education completed and post op cervical handout provided. We will sign off from acute OT.     If plan is discharge home, recommend the following:   Assistance with cooking/housework;Assist for transportation;Help with stairs or ramp for entrance     Functional Status Assessment   Patient has had a recent decline in their functional status and demonstrates the ability to make significant improvements in function in a reasonable and predictable amount of time. (all education completed and post op cervical handout provided as well as pt has family and PCA worker to A prn)     Equipment Recommendations   Tub/shower seat      Precautions/Restrictions   Precautions Precautions: Cervical Precaution Booklet Issued: Yes (comment) Recall of Precautions/Restrictions: Intact Required Braces or Orthoses: Cervical Brace Cervical Brace: Hard collar (per pt he was told by surgeon that he could have collar off prn) Restrictions Weight Bearing Restrictions Per Provider Order: No     Mobility Bed Mobility Overal bed mobility: Modified Independent                  Transfers Overall transfer level: Independent Equipment used: None               General transfer comment: ambulated in hallway at Mod I level (due to no shoes here and did not use a SPC)      Balance Overall balance assessment: Mild deficits observed, not formally tested (in standing)                                         ADL either performed or assessed with clinical judgement    ADL Overall ADL's : Needs assistance/impaired Eating/Feeding: Independent;Sitting     Grooming Details (indicate cue type and reason): Educated on not bending over the sink to take care of dentures           Upper Body Dressing Details (indicate cue type and reason): Educated when doing a pull over, put head in first then arms (reverese to remove shirt)   Lower Body Dressing Details (indicate cue type and reason): Educated on bringing legs/feet up to him not bending over           Tub/Shower Transfer Details (indicate cue type and reason): Educated on using HH shower or getting right under water to get wet/rinsed off (not turning, twisting, bending neck in shower)         Vision Baseline Vision/History: 1 Wears glasses Additional Comments: 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 (emotional trauma)            Pertinent Vitals/Pain Pain Assessment Pain Assessment: No/denies pain     Extremity/Trunk Assessment Upper Extremity Assessment Upper Extremity Assessment: Overall WFL for tasks assessed           Communication Communication Communication: No apparent difficulties   Cognition Arousal: Alert Behavior During Therapy: [facility] for tasks assessed/performed  Following commands: Intact       Cueing   Cueing Techniques: Verbal cues              Home Living Family/patient expects to be discharged to:: Private residence Living Arrangements: Alone Available Help at Discharge: Family;Personal care attendant Type of Home: Apartment Home Access: Elevator     Home Layout: One level     Bathroom Shower/Tub: Tub/shower unit;Curtain;Door   [employer] Toilet: Standard     Home Equipment: Cane - single point;BSC/3in1   Additional Comments: pt has an aide 7 days/wk for 3 hrs. Plans to go home to sister's house initially. She has 3 STE      Prior Functioning/Environment Prior Level of Function : Needs  assist;Driving             Mobility Comments: ambulates with cane, independent ADLs Comments: aide assists with showering and home mgmt    OT Problem List: Impaired balance (sitting and/or standing)        OT Goals(Current goals can be found in the care plan section)   Acute Rehab OT Goals Patient Stated Goal: home today         AM-PAC OT "6 Clicks" Daily Activity     Outcome Measure Help from another person eating meals?: None Help from another person taking care of personal grooming?: None Help from another person toileting, which includes using toliet, bedpan, or urinal?: None Help from another person bathing (including washing, rinsing, drying)?: None Help from another person to put on and taking off regular upper body clothing?: None Help from another person to put on and taking off regular lower body clothing?: None 6 Click Score: 24   End of Session Equipment Utilized During Treatment: Cervical collar  Activity Tolerance: Patient tolerated treatment well Patient left:  (sitting EOB)  OT Visit Diagnosis: Other abnormalities of gait and mobility (R26.89)                Time: 0812-0829 OT Time Calculation (min): 17 min Charges:  OT General Charges $OT Visit: 1 Visit OT Evaluation $OT Eval Moderate Complexity: 1 Mod  Cathy L. OT Acute Rehabilitation Services Office [phone]    Evette Georges 09/17/2023, 9:19 AM

## 2023-09-17 NOTE — Op Note (Signed)
 OPERATIVE REPORT   DATE OF SURGERY: 09/15/2023   PATIENT NAME:  Brett FRANEK Sr. 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  Prior ACDF C3-5 and C6-7.   POST-OPERATIVE DIAGNOSIS: Same   PROCEDURE:   ACDF C5-6   SURGEON:  Venita Lick, MD   PHYSICIAN ASSISTANT: Luther Bradley   ANESTHESIA:   General   EBL: 50 ml    Complications: None   Implants: Revel-S cervical cage:  6 to 9 mm lordotic expandable cage.   Graft: DBX mix and DBX   BRIEF HISTORY: Brett Wallace. is a 63 y.o. male who presents with ongoing significant neck and radicular arm pain.  Attempts at conservative management had failed to alleviate his symptoms.  Patient had foraminal stenosis and adjacent segment cervical disease.  I was a result we elected to move forward with a revision surgery.  All appropriate risks, benefits, and alternatives were discussed and consent was obtained.   PROCEDURE DETAILS: Patient was brought into the operating room and was properly positioned on the operating room table.  After induction with general anesthesia the patient was endotracheally intubated.  A timeout was taken to confirm all important data: including patient, procedure, and the level. Teds, SCD's were applied.    Anterior cervical spine was prepped and draped in a standard fashion.  Using fluoroscopy I marked out the C5-6 disc space.  The incision site was infiltrated with quarter percent Marcaine with epinephrine.  I elected to proceed with a right sided approach as there was less palpable scar tissue on the side.  Transverse incision was made and sharp dissection was carried out down to the platysma.  The platysma was sharply excised and identified the sternocleidomastoid.  I began dissecting along the medial border of the sternocleidomastoid in the avascular plane.  This is a standard Smith-Robinson approach to the cervical spine.  I  ultimately was able to identify the carotid artery.  I protected this with my finger.  I could now palpate the anterior aspect of the cervical spine.  I swept the trach and esophagus to the left and protected with a hand-held retractor.  The disc space was identified and marked with a needle.  X-ray was taken confirming I was at the appropriate level.  I removed the needle and marked the disc space with the Bovie.  Using bipolar cautery I mobilized the longus coli muscles bilaterally from the midportion of C5 to the midportion of the C6.  The anterior traction spurs were isolated and resected with Leksell rongeur.   Annulotomy was performed a 15 blade scalpel and I began removing all of the disc material with pituitary rongeurs and curettes.  I continued to work all the way posteriorly until I could see the posterior annulus.  I then used a fine nerve hook to dissect through the annulus.  Then used a 1 mm Kerrison rongeur to resect the posterior osteophyte from the vertebral bodies of C5 and C6 and remove the posterior annulus.  This allowed me to undercut the uncovertebral joint bilaterally to improve the foraminal space.  Using live fluoroscopy I confirmed that parallel endplate distraction using a lamina spreader.  I also confirmed satisfactory decompression by able to visualize my nerve hook under the vertebral body of C5 and C6 and under the uncovertebral joints bilaterally.   Once the decompression/discectomy proved satisfactory I rasped the endplates to bleeding subchondral bone.  I elected to  use the medium implant as it provided the best overall fit.  The implant was packed with DBX mix and gently inserted to the appropriate depth.  It was countersunk.  I then expanded it to a final height of 7.5 mm.  I then backfilled the cage with DBX.  214 mm locking screws were then placed through the cage and into the vertebral body: one in the body of C6 and the other in C5.  Both screws had excellent purchase.   The locking device was engaged per Engineer, agricultural.  I removed the retractors and irrigated the wound copiously normal saline.  I made sure that hemostasis using bipolar cautery and Floseal.  After final irrigation and confirmation of hemostasis the trach and esophagus were returned to midline and final x-rays were taken.  The new implant had satisfactory overall position in both the AP and lateral planes.   The platysma was reapproximated with interrupted 2-0 Vicryl sutures on the skin with a 3-0 Monocryl.  Steri-Strips and a dry dressing were applied as well as a [place] collar.  The patient was ultimately extubated transferred the PACU without incident.  The end of the case all needle sponge counts were correct.  There were no adverse intraoperative events.       Venita Lick, MD 09/15/2023 2:58 PM '

## 2023-09-17 NOTE — Progress Notes (Signed)
 Physical Therapy Treatment  Patient Details Name: Brett CYGAN Sr. MRN: [id number] DOB: 04-20-1961 Today's Date: 09/17/2023   History of Present Illness Pt is 63 yo male who presents on 09/15/23 for ACDF C5-6. Has past h/o ACDF C3-5 and C6-7. Other PMH: L eye blindness, DM, OA, HTN    PT Comments  Pt progressing well with post-op mobility. He was able to demonstrate transfers and ambulation with gross modified independence and SPC for support. Reinforced education on precautions, brace application/wearing schedule, appropriate activity progression, and car transfer. Will continue to follow.      If plan is discharge home, recommend the following: A little help with walking and/or transfers;A little help with bathing/dressing/bathroom;Assist for transportation;Help with stairs or ramp for entrance   Can travel by private vehicle        Equipment Recommendations  None recommended by PT    Recommendations for Other Services OT consult     Precautions / Restrictions Precautions Precautions: Cervical Precaution Booklet Issued: Yes (comment) Recall of Precautions/Restrictions: Intact Precaution/Restrictions Comments: reviewed proper posture and positioning Required Braces or Orthoses: Cervical Brace Cervical Brace: Hard collar (per pt he was told by surgeon that he could have collar off prn) Restrictions Weight Bearing Restrictions Per Provider Order: No     Mobility  Bed Mobility Overal bed mobility: Modified Independent             General bed mobility comments: Verbally reviewed log roll technique. Pt was able to transition from supine to sit with HOB elevated without assist.    Transfers Overall transfer level: Modified independent Equipment used: None Transfers: Sit to/from Stand             General transfer comment: No assist required.    Ambulation/Gait Ambulation/Gait assistance: Supervision Gait Distance (Feet): 200 Feet Assistive device: 1 person  hand held assist Gait Pattern/deviations: Step-through pattern, Trunk flexed Gait velocity: decreased Gait velocity interpretation: 1.31 - 2.62 ft/sec, indicative of limited community ambulator   General Gait Details: without support pt maintaining trunk flexion and reaching for wall. Pt given therapist's hand to simulate SPC and instructed to put wt through and he was able to maintain more erect posture and was more steady without reaching out for stable surfaces   Stairs Stairs: Yes Stairs assistance: Supervision Stair Management: One rail Left, Step to pattern, Forwards, With cane Number of Stairs: 3 General stair comments: VC's for sequencing and general safety   Wheelchair Mobility     Tilt Bed    Modified Rankin (Stroke Patients Only)       Balance Overall balance assessment: Mild deficits observed, not formally tested (in standing)                                          Communication Communication Communication: No apparent difficulties  Cognition Arousal: Alert Behavior During Therapy: WFL for tasks assessed/performed   PT - Cognitive impairments: No apparent impairments                         Following commands: Intact      Cueing Cueing Techniques: Verbal cues  Exercises      General Comments        Pertinent Vitals/Pain Pain Assessment Pain Assessment: Faces Faces Pain Scale: Hurts little more Pain Location: neck Pain Descriptors / Indicators: Sore Pain Intervention(s): Limited activity within  patient's tolerance, Monitored during session, Repositioned    Home Living Family/patient expects to be discharged to:: Private residence Living Arrangements: Alone Available Help at Discharge: Family;Personal care attendant Type of Home: Apartment Home Access: Elevator       Home Layout: One level Home Equipment: Cane - single point;BSC/3in1 Additional Comments: pt has an aide 7 days/wk for 3 hrs. Plans to go home to  sister's house initially. She has 3 STE    Prior Function            PT Goals (current goals can now be found in the care plan section) Acute Rehab PT Goals Patient Stated Goal: return home PT Goal Formulation: With patient Time For Goal Achievement: 09/23/23 Potential to Achieve Goals: Good Progress towards PT goals: Progressing toward goals    Frequency    Min 1X/week      PT Plan      Co-evaluation              AM-PAC PT "6 Clicks" Mobility   Outcome Measure  Help needed turning from your back to your side while in a flat bed without using bedrails?: A Little Help needed moving from lying on your back to sitting on the side of a flat bed without using bedrails?: A Little Help needed moving to and from a bed to a chair (including a wheelchair)?: A Little Help needed standing up from a chair using your arms (e.g., wheelchair or bedside chair)?: A Little Help needed to walk in hospital room?: A Little Help needed climbing 3-5 steps with a railing? : A Little 6 Click Score: 18    End of Session   Activity Tolerance: Patient tolerated treatment well Patient left: in chair;with call bell/phone within reach Nurse Communication: Mobility status PT Visit Diagnosis: Unsteadiness on feet (R26.81);Difficulty in walking, not elsewhere classified (R26.2);Pain Pain - part of body:  (neck)     Time: 8657-8469 PT Time Calculation (min) (ACUTE ONLY): 16 min  Charges:    $Gait Training: 8-22 mins PT General Charges $$ ACUTE PT VISIT: 1 Visit                     Brett Wallace, PT, DPT Acute Rehabilitation Services Secure Chat Preferred Office: [phone]    Marylynn Pearson 09/17/2023, 9:38 AM
